# Patient Record
Sex: Female | Born: 1960 | Race: White | Hispanic: No | Smoking: Never smoker
Health system: Southern US, Community
[De-identification: ages and names within clinical notes are randomized; demographics above are authoritative.]

## PROBLEM LIST (undated history)

## (undated) DIAGNOSIS — R928 Other abnormal and inconclusive findings on diagnostic imaging of breast: Secondary | ICD-10-CM

## (undated) DIAGNOSIS — E079 Disorder of thyroid, unspecified: Secondary | ICD-10-CM

## (undated) DIAGNOSIS — E66811 Obesity, class 1: Secondary | ICD-10-CM

## (undated) DIAGNOSIS — E669 Obesity, unspecified: Secondary | ICD-10-CM

## (undated) DIAGNOSIS — K635 Polyp of colon: Secondary | ICD-10-CM

## (undated) DIAGNOSIS — J189 Pneumonia, unspecified organism: Secondary | ICD-10-CM

## (undated) DIAGNOSIS — Z9289 Personal history of other medical treatment: Secondary | ICD-10-CM

## (undated) DIAGNOSIS — A159 Respiratory tuberculosis unspecified: Secondary | ICD-10-CM

## (undated) HISTORY — DX: Obesity, unspecified: E66.9

## (undated) HISTORY — DX: Polyp of colon: K63.5

## (undated) HISTORY — PX: BREAST LUMPECTOMY: SHX2

## (undated) HISTORY — DX: Respiratory tuberculosis unspecified: A15.9

## (undated) HISTORY — DX: Disorder of thyroid, unspecified: E07.9

## (undated) HISTORY — PX: CHOLECYSTECTOMY: SHX55

## (undated) HISTORY — DX: Personal history of other medical treatment: Z92.89

## (undated) HISTORY — PX: TOTAL ABDOMINAL HYSTERECTOMY: SHX209

## (undated) HISTORY — DX: Pneumonia, unspecified organism: J18.9

## (undated) HISTORY — DX: Other abnormal and inconclusive findings on diagnostic imaging of breast: R92.8

## (undated) HISTORY — DX: Obesity, class 1: E66.811

## (undated) HISTORY — PX: ANGIOPLASTY: SHX39

---

## 2012-02-03 ENCOUNTER — Encounter: Payer: Self-pay | Admitting: Obstetrics & Gynecology

## 2012-02-03 ENCOUNTER — Ambulatory Visit (INDEPENDENT_AMBULATORY_CARE_PROVIDER_SITE_OTHER): Payer: BC Managed Care – PPO | Admitting: Obstetrics & Gynecology

## 2012-02-03 VITALS — BP 134/78 | HR 71 | Temp 98.0°F | Resp 16 | Ht 64.5 in | Wt 196.0 lb

## 2012-02-03 DIAGNOSIS — Z124 Encounter for screening for malignant neoplasm of cervix: Secondary | ICD-10-CM

## 2012-02-03 DIAGNOSIS — Z0000 Encounter for general adult medical examination without abnormal findings: Secondary | ICD-10-CM

## 2012-02-03 DIAGNOSIS — N812 Incomplete uterovaginal prolapse: Secondary | ICD-10-CM

## 2012-02-03 NOTE — Addendum Note (Signed)
Addended by: Granville Lewis on: 02/03/2012 04:45 PM   Modules accepted: Orders

## 2012-02-03 NOTE — Progress Notes (Signed)
Subjective:    Kylie Flores is a 51 y.o. female who presents for an annual exam. She has several issues today. She has worsening uterine prolapse associated with constant pelvic "pressure" and polyuria. It sometimes interferes with sex.  The patient is sexually active. GYN screening history: last pap: was normal. The patient wears seatbelts: yes. The patient participates in regular exercise: no. Has the patient ever been transfused or tattooed?: yes ( postpartum transfusion). The patient reports that there is not domestic violence in her life.   Menstrual History: OB History    Grav Para Term Preterm Abortions TAB SAB Ect Mult Living   2 2 2       2       Menarche age: 56 No LMP recorded. Patient is postmenopausal.    The following portions of the patient's history were reviewed and updated as appropriate: allergies, current medications, past family history, past medical history, past social history, past surgical history and problem list.  Review of Systems A comprehensive review of systems was negative. 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 Her mammogram is up to date and normal. She has worked on weight loss and has lost 30 # this year.   Objective:    BP 134/78  Pulse 71  Temp 98 F (36.7 C) (Oral)  Resp 16  Ht 5' 4.5" (1.638 m)  Wt 196 lb (88.905 kg)  BMI 33.12 kg/m2  General Appearance:    Alert, cooperative, no distress, appears stated age  Head:    Normocephalic, without obvious abnormality, atraumatic  Eyes:    PERRL, conjunctiva/corneas clear, EOM's intact, fundi    benign, both eyes  Ears:    Normal TM's and external ear canals, both ears  Nose:   Nares normal, septum midline, mucosa normal, no drainage    or sinus tenderness  Throat:   Lips, mucosa, and tongue normal; teeth and gums normal  Neck:   Supple, symmetrical, trachea midline, no adenopathy;    thyroid:  no enlargement/tenderness/nodules; no carotid   bruit or JVD  Back:      Symmetric, no curvature, ROM normal, no CVA tenderness  Lungs:     Clear to auscultation bilaterally, respirations unlabored  Chest Wall:    No tenderness or deformity   Heart:    Regular rate and rhythm, S1 and S2 normal, no murmur, rub   or gallop  Breast Exam:    No tenderness, masses, or nipple abnormality  Abdomen:     Soft, non-tender, bowel sounds active all four quadrants,    no masses, no organomegaly  Genitalia:    Normal female without lesion, discharge or tenderness, 3rd uterine prolapse, ULN size of uterus, no adnexal masses. I fitted her for a ring pessary (#4 worked well)     Extremities:   Extremities normal, atraumatic, no cyanosis or edema  Pulses:   2+ and symmetric all extremities  Skin:   Skin color, texture, turgor normal, no rashes or lesions  Lymph nodes:   Cervical, supraclavicular, and axillary nodes normal  Neurologic:   CNII-XII intact, normal strength, sensation and reflexes    throughout  .    Assessment:    Healthy female exam.    Plan:     Mammogram.  TSH

## 2012-02-04 LAB — LIPID PANEL
Cholesterol: 197 mg/dL (ref 0–200)
HDL: 79 mg/dL (ref >39)
Triglycerides: 74 mg/dL (ref <150)

## 2012-02-04 LAB — COMPREHENSIVE METABOLIC PANEL
Albumin: 3.9 g/dL (ref 3.5–5.2)
CO2: 27 mEq/L (ref 19–32)
Glucose, Bld: 83 mg/dL (ref 70–99)
Sodium: 139 mEq/L (ref 135–145)
Total Bilirubin: 0.2 mg/dL — ABNORMAL LOW (ref 0.3–1.2)
Total Protein: 6.9 g/dL (ref 6.0–8.3)

## 2012-02-04 LAB — TSH: TSH: 0.488 u[IU]/mL (ref 0.350–4.500)

## 2012-02-19 ENCOUNTER — Ambulatory Visit (INDEPENDENT_AMBULATORY_CARE_PROVIDER_SITE_OTHER): Payer: BC Managed Care – PPO | Admitting: Obstetrics & Gynecology

## 2012-02-19 ENCOUNTER — Encounter: Payer: Self-pay | Admitting: Obstetrics & Gynecology

## 2012-02-19 VITALS — BP 122/83 | HR 78 | Ht 64.5 in | Wt 196.0 lb

## 2012-02-19 DIAGNOSIS — N814 Uterovaginal prolapse, unspecified: Secondary | ICD-10-CM

## 2012-02-19 NOTE — Progress Notes (Signed)
  Subjective:    Patient ID: Kylie Flores, female    DOB: 1961-03-03, 51 y.o.   MRN: [id number]  HPI  Kylie Flores is here today for her pessary placement. At her annual exam I fitted her to a #4 ring. She is able to place and remove it today. It relieves her symptoms of prolapse and also causes her no pain.   Review of Systems     Objective:   Physical Exam        Assessment & Plan:  Symptomatic prolapse- pessary placed RTC 1 month for pessary check She will remove it at night.

## 2012-03-16 ENCOUNTER — Ambulatory Visit: Payer: BC Managed Care – PPO | Admitting: Obstetrics & Gynecology

## 2012-03-16 DIAGNOSIS — N814 Uterovaginal prolapse, unspecified: Secondary | ICD-10-CM

## 2014-04-24 ENCOUNTER — Encounter: Payer: Self-pay | Admitting: Obstetrics & Gynecology

## 2015-08-20 ENCOUNTER — Encounter: Payer: Self-pay | Admitting: Obstetrics & Gynecology

## 2015-08-20 ENCOUNTER — Ambulatory Visit (INDEPENDENT_AMBULATORY_CARE_PROVIDER_SITE_OTHER): Payer: BLUE CROSS/BLUE SHIELD | Admitting: Obstetrics & Gynecology

## 2015-08-20 VITALS — BP 118/71 | HR 69 | Resp 16 | Ht 64.5 in | Wt 199.0 lb

## 2015-08-20 DIAGNOSIS — Z01419 Encounter for gynecological examination (general) (routine) without abnormal findings: Secondary | ICD-10-CM

## 2015-08-20 DIAGNOSIS — Z1151 Encounter for screening for human papillomavirus (HPV): Secondary | ICD-10-CM | POA: Diagnosis not present

## 2015-08-20 DIAGNOSIS — N812 Incomplete uterovaginal prolapse: Secondary | ICD-10-CM | POA: Diagnosis not present

## 2015-08-20 DIAGNOSIS — Z124 Encounter for screening for malignant neoplasm of cervix: Secondary | ICD-10-CM

## 2015-08-20 NOTE — Progress Notes (Signed)
Subjective:    Kylie Flores is a 55 y.o. DW P2 (81 and 63 yo kids, 4 grands) female who presents for an annual exam. She is having a lot of urinary issues, gets up every 2 hours at night to pee, has to stop her travels during the day at least every hour to void. Some occasions of post void dribbling.  The patient is sexually active. GYN screening history: last pap: was normal. The patient wears seatbelts: yes. The patient participates in regular exercise: yes. Has the patient ever been transfused or tattooed?: yes. The patient reports that there is not domestic violence in her life.   Menstrual History: OB History    Gravida Para Term Preterm AB TAB SAB Ectopic Multiple Living   Menarche age: 39  No LMP recorded. Patient is postmenopausal.    The following portions of the patient's history were reviewed and updated as appropriate: allergies, current medications, past family history, past medical history, past social history, past surgical history and problem list.  Review of Systems Pertinent items are noted in HPI.   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  Mammo utd. Flu vaccine 10/16. Colonoscopy due next year (q 3 years). She has dyspareunia due to dryness and the prolapse. She did not like the pessary.  Objective:    BP 118/71 mmHg  Pulse 69  Resp 16  Ht 5' 4.5" (1.638 m)  Wt 199 lb (90.266 kg)  BMI 33.64 kg/m2  General Appearance:    Alert, cooperative, no distress, appears stated age  Head:    Normocephalic, without obvious abnormality, atraumatic  Eyes:    PERRL, conjunctiva/corneas clear, EOM's intact, fundi    benign, both eyes  Ears:    Normal TM's and external ear canals, both ears  Nose:   Nares normal, septum midline, mucosa normal, no drainage    or sinus tenderness  Throat:   Lips, mucosa, and tongue normal; teeth and gums normal  Neck:   Supple, symmetrical, trachea midline, no adenopathy;    thyroid:  no  enlargement/tenderness/nodules; no carotid   bruit or JVD  Back:     Symmetric, no curvature, ROM normal, no CVA tenderness  Lungs:     Clear to auscultation bilaterally, respirations unlabored  Chest Wall:    No tenderness or deformity   Heart:    Regular rate and rhythm, S1 and S2 normal, no murmur, rub   or gallop  Breast Exam:    No tenderness, masses, or nipple abnormality  Abdomen:     Soft, non-tender, bowel sounds active all four quadrants,    no masses, no organomegaly  Genitalia:    Normal female without lesion, discharge or tenderness, moderate atrophy, 3-4th degree uterine prolapse, 2nd degree cystocele, NSSA, NT, mobile, no palpable adnexal masses     Extremities:   Extremities normal, atraumatic, no cyanosis or edema  Pulses:   2+ and symmetric all extremities  Skin:   Skin color, texture, turgor normal, no rashes or lesions  Lymph nodes:   Cervical, supraclavicular, and axillary nodes normal  Neurologic:   CNII-XII intact, normal strength, sensation and reflexes    throughout  .    Assessment:    Healthy female exam.   Urinary issue with prolapse   Plan:     Thin prep Pap smear. with cotesting I will refer her to Dr. Lavella Hammock so she  can get both the uro and the gyn fixed by the same person.

## 2015-08-21 ENCOUNTER — Telehealth: Payer: Self-pay | Admitting: *Deleted

## 2015-08-21 DIAGNOSIS — N952 Postmenopausal atrophic vaginitis: Secondary | ICD-10-CM

## 2015-08-21 MED ORDER — ESTRADIOL 0.1 MG/GM VA CREA: 1.0000 | TOPICAL_CREAM | VAGINAL | Status: DC

## 2015-08-21 NOTE — Telephone Encounter (Signed)
RX for Estrace vaginal cream use 1 GM vaginally 3x weekly per Dr Marice Potter.

## 2015-08-22 LAB — CYTOLOGY - PAP

## 2016-10-03 DIAGNOSIS — S42292A Other displaced fracture of upper end of left humerus, initial encounter for closed fracture: Secondary | ICD-10-CM | POA: Insufficient documentation

## 2017-03-10 ENCOUNTER — Encounter: Payer: Self-pay | Admitting: *Deleted

## 2017-06-26 ENCOUNTER — Emergency Department
Admission: EM | Admit: 2017-06-26 | Discharge: 2017-06-26 | Disposition: A | Discharge status: Home / Self Care | Payer: BLUE CROSS/BLUE SHIELD | Source: Home / Self Care | Attending: Family Medicine | Admitting: Family Medicine

## 2017-06-26 ENCOUNTER — Other Ambulatory Visit: Payer: Self-pay

## 2017-06-26 DIAGNOSIS — B9789 Other viral agents as the cause of diseases classified elsewhere: Secondary | ICD-10-CM

## 2017-06-26 DIAGNOSIS — J020 Streptococcal pharyngitis: Secondary | ICD-10-CM

## 2017-06-26 DIAGNOSIS — J069 Acute upper respiratory infection, unspecified: Secondary | ICD-10-CM

## 2017-06-26 LAB — POCT RAPID STREP A (OFFICE): RAPID STREP A SCREEN: POSITIVE — AB

## 2017-06-26 MED ORDER — AMOXICILLIN 875 MG PO TABS: 875.0000 mg | ORAL_TABLET | Freq: Two times a day (BID) | ORAL | 0 refills | Status: DC

## 2017-06-26 NOTE — Discharge Instructions (Signed)
Take plain guaifenesin (1200mg  extended release tabs such as Mucinex) twice daily, with plenty of water, for cough and congestion.  May add Pseudoephedrine (30mg , one or two every 4 to 6 hours) for sinus congestion.  Get adequate rest.   May use Afrin nasal spray (or generic oxymetazoline) each morning for about 5 days and then discontinue.  Also recommend using saline nasal spray several times daily and saline nasal irrigation (AYR is a common brand).  Use Flonase nasal spray each morning after using Afrin nasal spray and saline nasal irrigation. Try warm salt water gargles for sore throat.  Stop all antihistamines for now, and other non-prescription cough/cold preparations. May take Ibuprofen 200mg , 4 tabs every 8 hours with food for sore throat, body aches, fever, etc. May continue Tussionex at bedtime for cough.  .Marland Kitchen

## 2017-06-26 NOTE — ED Triage Notes (Signed)
Has been sick since last Saturday.  Had a flu test done on Wednesday, negative.

## 2017-06-26 NOTE — ED Provider Notes (Signed)
[facility]    CSN: [id number] Arrival date & time: 06/26/17  0956     History   Chief Complaint Chief Complaint  Patient presents with  . Cough  . Sore Throat  . Fever    HPI Kylie Flores is a 57 y.o. female.   One week ago patient developed typical cold-like symptoms developing over several days, including sore throat, sinus congestion, headache, fatigue, and cough.  Yesterday she developed sweats and fever.  She has had a persistent sore throat. She had a negative test for influenza two days ago.   The history is provided by the patient.    Past Medical History:  Diagnosis Date  . History of blood transfusion   . Obesity (BMI 30.0-34.9)   . Pneumonia   . Thyroid disease   . Tuberculosis     There are no active problems to display for this patient.   Past Surgical History:  Procedure Laterality Date  . ANGIOPLASTY    . BREAST LUMPECTOMY    . CHOLECYSTECTOMY      OB History    Gravida Para Term Preterm AB Living   2 2 2     2    SAB TAB Ectopic Multiple Live Births                   Home Medications    Prior to Admission medications   Medication Sig Start Date End Date Taking? Authorizing Provider  amoxicillin (AMOXIL) 875 MG tablet Take 1 tablet (875 mg total) by mouth 2 (two) times daily. 06/26/17   Lattie Haw, MD  estradiol (ESTRACE VAGINAL) 0.1 MG/GM vaginal cream Place 1 Applicatorful vaginally 3 (three) times a week. 08/21/15   Allie Bossier, MD    Family History Family History  Problem Relation Age of Onset  . Diabetes Maternal Grandfather   . Cancer Mother        skin    Social History Social History   Tobacco Use  . Smoking status: Never Smoker  . Smokeless tobacco: Never Used  Substance Use Topics  . Alcohol use: Yes    Comment: rarely  . Drug use: No     Allergies   Patient has no known allergies.   Review of Systems Review of Systems + sore throat + cough No pleuritic pain No wheezing + nasal  congestion + post-nasal drainage No sinus pain/pressure No itchy/red eyes ? earache No hemoptysis No SOB + fever, + chills No nausea No vomiting No abdominal pain No diarrhea No urinary symptoms No skin rash + fatigue No myalgias + headache Used OTC meds without relief   Physical Exam Triage Vital Signs ED Triage Vitals [06/26/17 1031]  Enc Vitals Group     BP 121/87     Pulse Rate 98     Resp      Temp 98.7 F (37.1 C)     Temp src      SpO2 96 %     Weight 204 lb (92.5 kg)     Height 5' 4.5" (1.638 m)     Head Circumference      Peak Flow      Pain Score 0     Pain Loc      Pain Edu?      Excl. in GC?    No data found.  Updated Vital Signs BP 121/87 (BP Location: Right Arm)   Pulse 98   Temp 98.7 F (37.1 C)   Ht  5' 4.5" (1.638 m)   Wt 204 lb (92.5 kg)   SpO2 96%   BMI 34.48 kg/m   Visual Acuity Right Eye Distance:   Left Eye Distance:   Bilateral Distance:    Right Eye Near:   Left Eye Near:    Bilateral Near:     Physical Exam Nursing notes and Vital Signs reviewed. Appearance:  Patient appears stated age, and in no acute distress Eyes:  Pupils are equal, round, and reactive to light and accomodation.  Extraocular movement is intact.  Conjunctivae are not inflamed  Ears:  Canals normal.  Tympanic membranes normal.  Nose:  Mildly congested turbinates.  No sinus tenderness.   Pharynx:  Erythematous Neck:  Supple.  Enlarged posterior/lateral nodes are palpated bilaterally, tender to palpation on the left.  Tender tonsillar nodes. Lungs:  Clear to auscultation.  Breath sounds are equal.  Moving air well. Heart:  Regular rate and rhythm without murmurs, rubs, or gallops.  Abdomen:  Nontender without masses or hepatosplenomegaly.  Bowel sounds are present.  No CVA or flank tenderness.  Extremities:  No edema.  Skin:  No rash present.    UC Treatments / Results  Labs (all labs ordered are listed, but only abnormal results are displayed) Labs  Reviewed  POCT RAPID STREP A (OFFICE) - Abnormal; Notable for the following components:      Result Value   Rapid Strep A Screen Positive (*)    All other components within normal limits    EKG  EKG Interpretation None       Radiology No results found.  Procedures Procedures (including critical care time)  Medications Ordered in UC Medications - No data to display   Initial Impression / Assessment and Plan / UC Course  I have reviewed the triage vital signs and the nursing notes.  Pertinent labs & imaging results that were available during my care of the patient were reviewed by me and considered in my medical decision making (see chart for details).    Begin amoxicillin for 10 days. Take plain guaifenesin (1200mg  extended release tabs such as Mucinex) twice daily, with plenty of water, for cough and congestion.  May add Pseudoephedrine (30mg , one or two every 4 to 6 hours) for sinus congestion.  Get adequate rest.   May use Afrin nasal spray (or generic oxymetazoline) each morning for about 5 days and then discontinue.  Also recommend using saline nasal spray several times daily and saline nasal irrigation (AYR is a common brand).  Use Flonase nasal spray each morning after using Afrin nasal spray and saline nasal irrigation. Try warm salt water gargles for sore throat.  Stop all antihistamines for now, and other non-prescription cough/cold preparations. May take Ibuprofen 200mg , 4 tabs every 8 hours with food for sore throat, body aches, fever, etc. May continue Tussionex at bedtime for cough. Followup with Family Doctor if not improved in 10 days.    Final Clinical Impressions(s) / UC Diagnoses   Final diagnoses:  Strep pharyngitis  Viral URI with cough    ED Discharge Orders        Ordered    amoxicillin (AMOXIL) 875 MG tablet  2 times daily     06/26/17 1112           Lattie HawBeese, Sarah-Jane Nazario A, MD 06/26/17 1346

## 2018-02-16 ENCOUNTER — Encounter: Payer: Self-pay | Admitting: *Deleted

## 2018-03-08 ENCOUNTER — Other Ambulatory Visit: Payer: Self-pay | Admitting: Sports Medicine

## 2018-03-08 DIAGNOSIS — S4991XA Unspecified injury of right shoulder and upper arm, initial encounter: Secondary | ICD-10-CM

## 2018-03-12 ENCOUNTER — Ambulatory Visit
Admission: RE | Admit: 2018-03-12 | Discharge: 2018-03-12 | Disposition: A | Discharge status: Home / Self Care | Payer: Worker's Compensation | Source: Ambulatory Visit | Attending: Sports Medicine | Admitting: Sports Medicine

## 2018-03-12 DIAGNOSIS — S4991XA Unspecified injury of right shoulder and upper arm, initial encounter: Secondary | ICD-10-CM

## 2018-04-03 DIAGNOSIS — S42254A Nondisplaced fracture of greater tuberosity of right humerus, initial encounter for closed fracture: Secondary | ICD-10-CM

## 2018-04-03 HISTORY — DX: Nondisplaced fracture of greater tuberosity of right humerus, initial encounter for closed fracture: S42.254A

## 2019-03-17 ENCOUNTER — Encounter: Payer: Self-pay | Admitting: *Deleted

## 2019-03-21 ENCOUNTER — Telehealth: Payer: Self-pay | Admitting: Obstetrics & Gynecology

## 2019-03-21 NOTE — Telephone Encounter (Signed)
I received the results of her suspicious mammogram. I have not seen her since 2017. I called and left a voicemail about the mammogram and the need for followup. I will send Sherryle Lis a message to check with her again.

## 2019-03-29 ENCOUNTER — Encounter: Payer: Self-pay | Admitting: *Deleted

## 2019-03-31 ENCOUNTER — Encounter: Payer: Self-pay | Admitting: *Deleted

## 2019-04-05 ENCOUNTER — Encounter: Payer: Self-pay | Admitting: *Deleted

## 2019-10-05 ENCOUNTER — Encounter: Payer: Self-pay | Admitting: Obstetrics & Gynecology

## 2019-10-05 DIAGNOSIS — R928 Other abnormal and inconclusive findings on diagnostic imaging of breast: Secondary | ICD-10-CM | POA: Insufficient documentation

## 2019-11-04 IMAGING — MR MR SHOULDER*R* W/O CM
5 series · 34 of 40 positions shown · non-contrast
Comparison: None.

CLINICAL DATA: Right shoulder pain with limited range of motion and
weakness since falling 3 months ago. No previous relevant surgery.

EXAM:
MRI OF THE RIGHT SHOULDER WITHOUT CONTRAST
TECHNIQUE: Multiplanar, multisequence MR imaging of the shoulder was performed.
No intravenous contrast was administered.

[Series 3: PD fat-sat · axial · 4.0mm · 0.55mm/px · z∈[-32,+52]mm · 8 of 20 slices shown (1 of 2)]
[im 1/20]
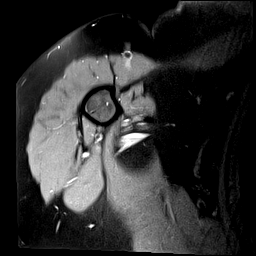
[im 3/20]
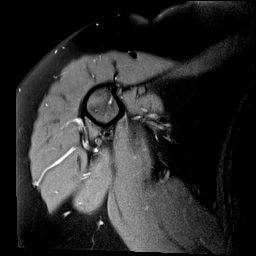
[im 7/20]
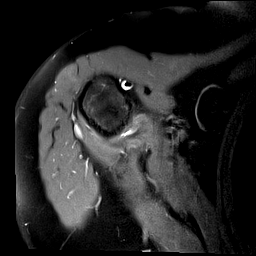
[im 9/20]
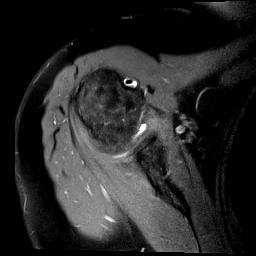
[im 11/20]
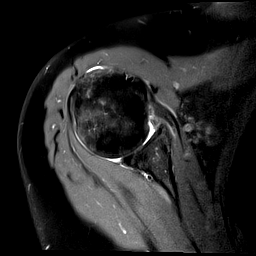
[im 13/20]
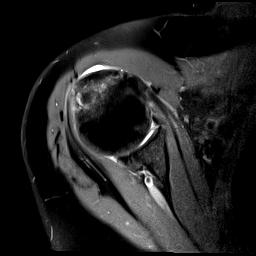
[im 17/20]
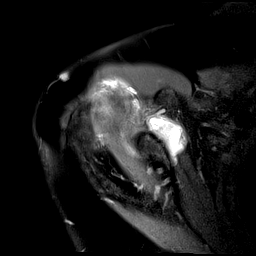
[im 20/20]
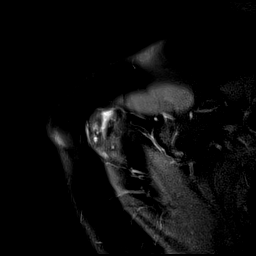

[Series 4: T2 fat-sat · oblique · 4.0mm · 0.55mm/px · 7 of 16 slices shown (1 of 2)]
[im 1/16]
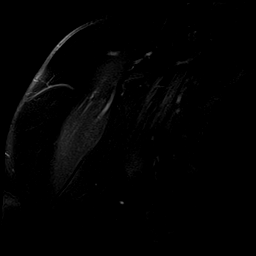
[im 3/16]
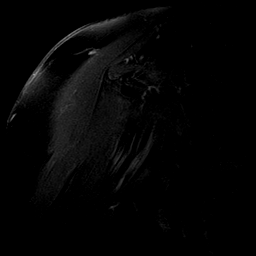
[im 6/16]
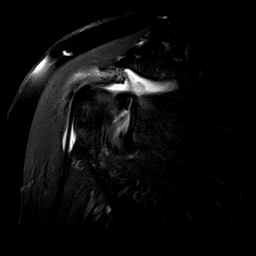
[im 8/16]
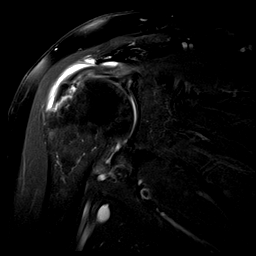
[im 11/16]
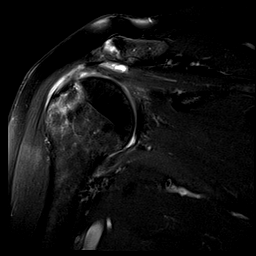
[im 13/16]
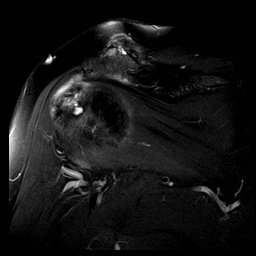
[im 16/16]
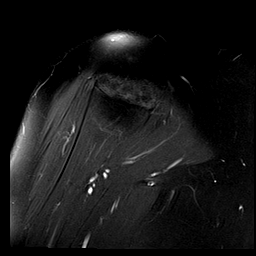

[Series 5: PD fat-sat · oblique · 4.0mm · 0.27mm/px · 7 of 16 slices shown (2 of 2)]
[im 1/16]
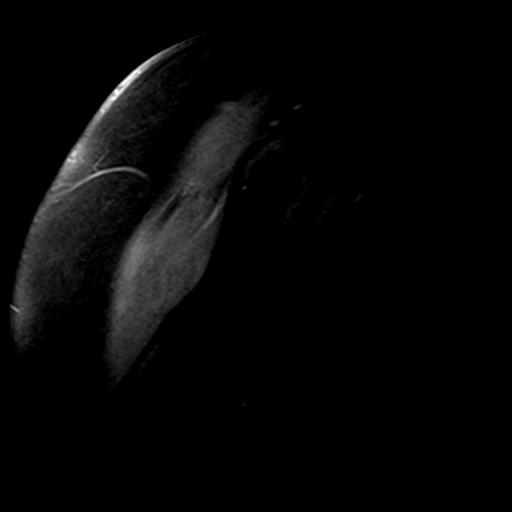
[im 3/16]
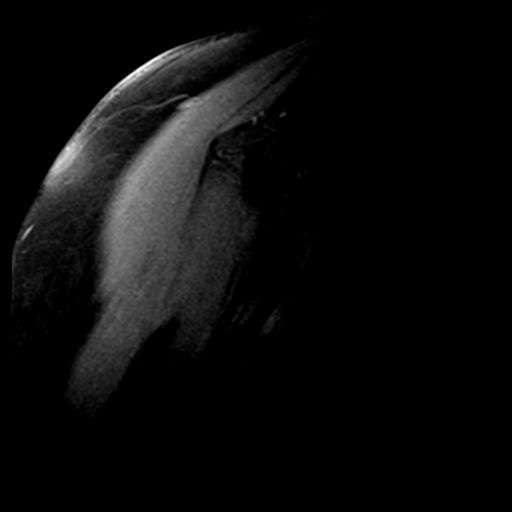
[im 6/16]
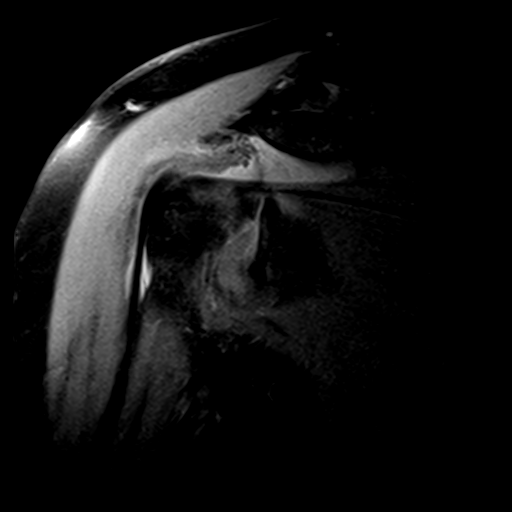
[im 8/16]
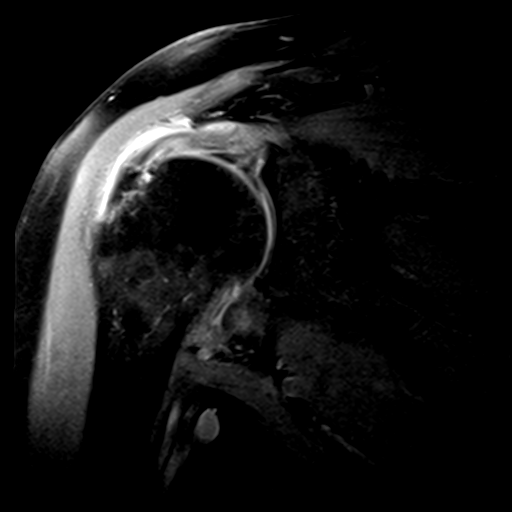
[im 11/16]
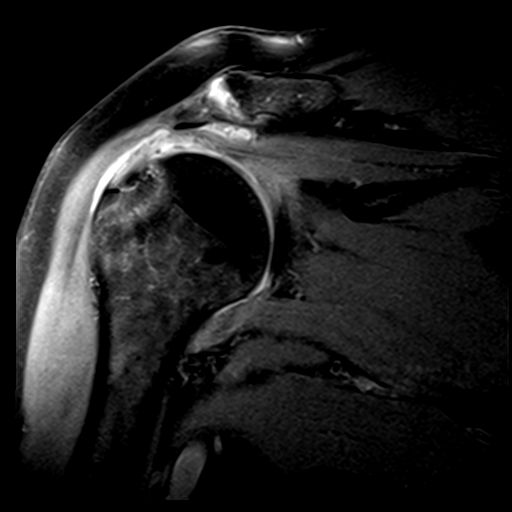
[im 13/16]
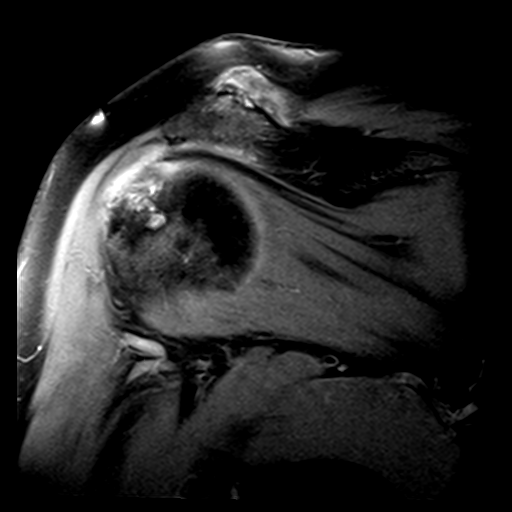
[im 16/16]
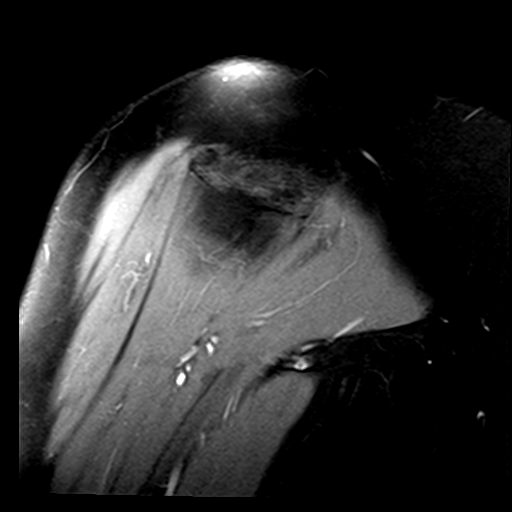

[Series 6: T1 · coronal · 4.0mm · 0.27mm/px · 4 of 18 slices shown]
[im 1/18]
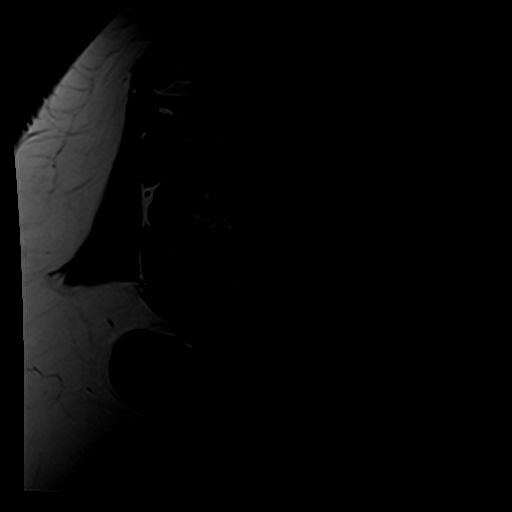
[im 3/18]
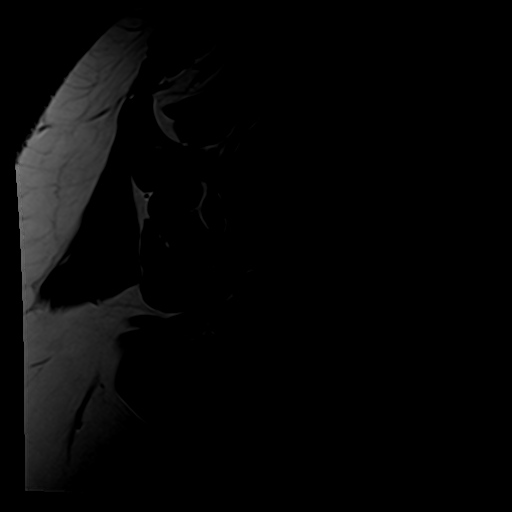
[im 5/18]
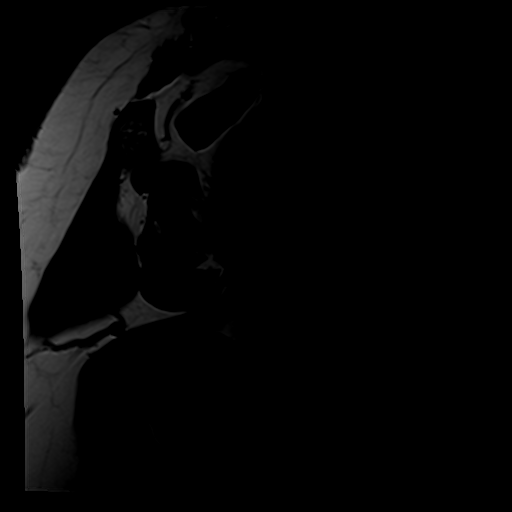
[im 8/18]
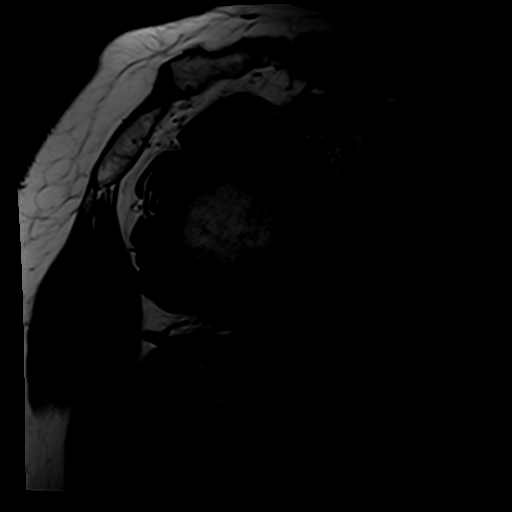

[Series 7: T2 fat-sat · coronal · 4.0mm · 0.55mm/px · 8 of 18 slices shown (2 of 2)]
[im 1/18]
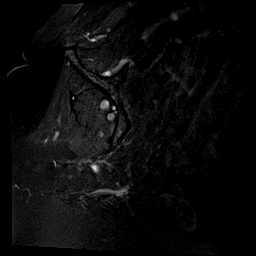
[im 3/18]
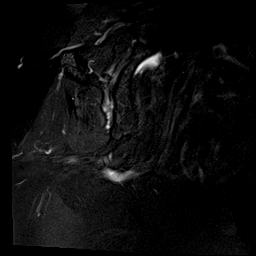
[im 5/18]
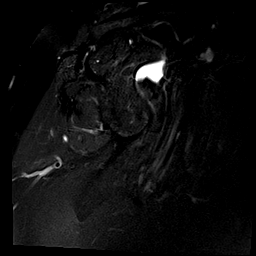
[im 8/18]
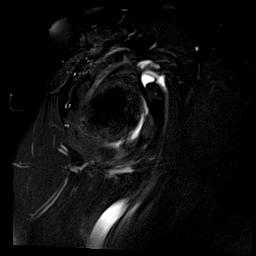
[im 10/18]
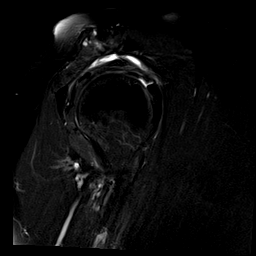
[im 13/18]
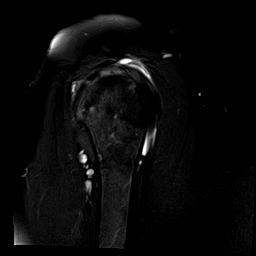
[im 15/18]
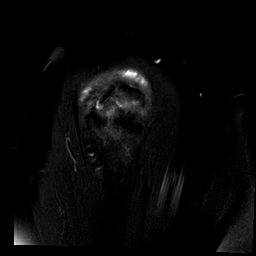
[im 18/18]
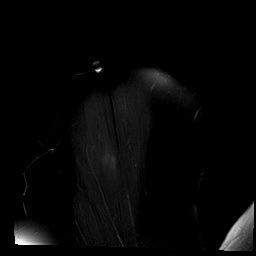

[34 of 40 positions shown; findings below may reference images not displayed]

FINDINGS: Rotator cuff: There is supraspinatus and infraspinatus tendinosis.
The infraspinatus tendon is attenuated with bursal and articular
surface irregularity, best seen on the coronal images. No
full-thickness tendon tear or tendon retraction identified. The
subscapularis and teres minor tendons appear normal.

Muscles:  No focal muscular atrophy or edema.

Biceps long head:  Intact and normally positioned.

Acromioclavicular Joint: The acromion is type 1. There are
mild-to-moderate acromioclavicular degenerative changes. There is a
moderate amount of fluid in the subacromial-subdeltoid bursa.

Glenohumeral Joint: No significant shoulder joint effusion. There
are mild glenohumeral degenerative changes.

Labrum: Labral assessment is limited by the lack of joint fluid.
There are degenerative changes in the labrum superiorly and
posteriorly. No discrete labral tear or paralabral cyst identified.

Bones: There is suspicion of a nondisplaced fracture involving the
greater tuberosity, best seen on coronal images 8 and 9. There is
mild adjacent marrow edema. There is subcortical cyst formation
posteriorly in the humeral head near the infraspinatus insertion. No
other acute osseous findings.

Other: No significant soft tissue findings.
IMPRESSION: 1. Suspicion of healing nondisplaced fracture of the greater
tuberosity. The supraspinatus and infraspinatus tendons insert on
this suspected avulsion fracture fragment. Plain film correlation
recommended.
2. Underlying supraspinatus and infraspinatus tendinosis with
partial tearing of the infraspinatus tendon. No evidence of
full-thickness rotator cuff tear.
3. Moderate subacromial-subdeltoid bursitis.
4. Mild-to-moderate acromioclavicular degenerative changes.
5. Mild labral degeneration without discrete tear.

## 2020-01-12 ENCOUNTER — Other Ambulatory Visit: Payer: Self-pay

## 2020-01-12 ENCOUNTER — Ambulatory Visit (INDEPENDENT_AMBULATORY_CARE_PROVIDER_SITE_OTHER): Payer: PRIVATE HEALTH INSURANCE | Admitting: Medical-Surgical

## 2020-01-12 ENCOUNTER — Encounter: Payer: Self-pay | Admitting: Medical-Surgical

## 2020-01-12 VITALS — BP 121/82 | HR 64 | Temp 97.9°F | Ht 64.75 in | Wt 212.3 lb

## 2020-01-12 DIAGNOSIS — Z1159 Encounter for screening for other viral diseases: Secondary | ICD-10-CM

## 2020-01-12 DIAGNOSIS — Z114 Encounter for screening for human immunodeficiency virus [HIV]: Secondary | ICD-10-CM

## 2020-01-12 DIAGNOSIS — Z7689 Persons encountering health services in other specified circumstances: Secondary | ICD-10-CM

## 2020-01-12 DIAGNOSIS — E079 Disorder of thyroid, unspecified: Secondary | ICD-10-CM

## 2020-01-12 DIAGNOSIS — L609 Nail disorder, unspecified: Secondary | ICD-10-CM | POA: Insufficient documentation

## 2020-01-12 DIAGNOSIS — I890 Lymphedema, not elsewhere classified: Secondary | ICD-10-CM | POA: Insufficient documentation

## 2020-01-12 DIAGNOSIS — Z0000 Encounter for general adult medical examination without abnormal findings: Secondary | ICD-10-CM | POA: Diagnosis not present

## 2020-01-12 DIAGNOSIS — Z23 Encounter for immunization: Secondary | ICD-10-CM | POA: Diagnosis not present

## 2020-01-12 DIAGNOSIS — R42 Dizziness and giddiness: Secondary | ICD-10-CM | POA: Insufficient documentation

## 2020-01-12 NOTE — Progress Notes (Signed)
zin  New Patient Office Visit  Subjective:  Patient ID: Kylie Flores, female    DOB: [birth year]  Age: 59 y.o. MRN: [id number]  CC:  Chief Complaint  Patient presents with  . Establish Care    HPI Kylie Flores presents to establish care.  No PCP care in several years.  Has been seen by dermatology who noted she white discoloration under her nailbeds.  This has been present since approximately November or December 2020.  Dermatology did check kidney and liver function but there were no abnormalities found.  They recommended she establish with a PCP for a full panel of blood work.  Has an appointment with gynecology next week due to an abnormal mammogram and follow up.  She does have a history of abnormal mammograms with a left breast lumpectomy.     History of thyroid mass with benign biopsy. No recent thyroid function labs assessed. Patient notes mass feels to have gotten bigger.  Lymphedema of the right leg s/p angioplasty and vein ablation. Chronic RLE edema.  History of vertigo with intermittent flares. 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  Past Medical History:  Diagnosis Date  . Abnormal mammogram   . Colon polyps   . History of blood transfusion   . Obesity (BMI 30.0-34.9)   . Pneumonia   . Thyroid disease   . Tuberculosis     Past Surgical History:  Procedure Laterality Date  . ABDOMINAL HYSTERECTOMY    . ANGIOPLASTY    . BREAST LUMPECTOMY    . CHOLECYSTECTOMY      Family History  Problem Relation Age of Onset  . Diabetes Maternal Grandfather   . Skin cancer Mother   . Colon cancer Maternal Grandmother     Social History   Socioeconomic History  . Marital status: Divorced    Spouse name: Not on file  . Number of children: Not on file  . Years of education: Not on file  . Highest education level: Not on file  Occupational History  . Occupation: adm Water engineer: [employer]  Tobacco Use  . Smoking status: Never  Smoker  . Smokeless tobacco: Never Used  Vaping Use  . Vaping Use: Never used  Substance and Sexual Activity  . Alcohol use: Yes    Comment: rarely  . Drug use: Never  . Sexual activity: Not Currently    Partners: Male  Other Topics Concern  . Not on file  Social History Narrative  . Not on file   Social Determinants of Health   Financial Resource Strain:   . Difficulty of Paying Living Expenses:   Food Insecurity:   . Worried About Programme researcher, broadcasting/film/video in the Last Year:   . Barista in the Last Year:   Transportation Needs:   . Freight forwarder (Medical):   Marland Kitchen Lack of Transportation (Non-Medical):   Physical Activity:   . Days of Exercise per Week:   . Minutes of Exercise per Session:   Stress:   . Feeling of Stress :   Social Connections:   . Frequency of Communication with Friends and Family:   . Frequency of Social Gatherings with Friends and Family:   . Attends Religious Services:   . Active Member of Clubs or Organizations:   . Attends Banker Meetings:   Marland Kitchen Marital Status:   Intimate Partner Violence:   . Fear of Current or Ex-Partner:   . Emotionally  Abused:   Marland Kitchen Physically Abused:   . Sexually Abused:     ROS Review of Systems  Constitutional: Negative.   HENT: Negative.   Eyes: Negative.   Respiratory: Negative.   Cardiovascular: Positive for leg swelling (right leg). Negative for chest pain and palpitations.  Gastrointestinal: Negative.   Genitourinary: Positive for frequency. Negative for dysuria, hematuria, urgency, vaginal bleeding, vaginal discharge and vaginal pain.  Musculoskeletal: Positive for arthralgias (left knee).  Skin: Negative.   Allergic/Immunologic: Negative for environmental allergies and food allergies.  Neurological: Positive for dizziness (occasional flares of vertigo). Negative for numbness.  Hematological: Negative.   Psychiatric/Behavioral: Negative.  Negative for self-injury and suicidal ideas.     Objective:   Today's Vitals: BP 121/82   Pulse 64   Temp 97.9 F (36.6 C) (Oral)   Ht 5' 4.75" (1.645 m)   Wt 212 lb 4.8 oz (96.3 kg)   SpO2 99%   BMI 35.60 kg/m   Physical Exam Vitals reviewed.  Constitutional:      General: She is not in acute distress.    Appearance: Normal appearance.  HENT:     Head: Normocephalic and atraumatic.     Right Ear: Tympanic membrane, ear canal and external ear normal. There is no impacted cerumen.     Left Ear: Tympanic membrane, ear canal and external ear normal. There is no impacted cerumen.     Nose: Nose normal.     Mouth/Throat:     Mouth: Mucous membranes are moist.     Pharynx: No oropharyngeal exudate or posterior oropharyngeal erythema.  Eyes:     Extraocular Movements: Extraocular movements intact.     Pupils: Pupils are equal, round, and reactive to light.  Neck:     Thyroid: Thyroid mass present. No thyroid tenderness.   Cardiovascular:     Rate and Rhythm: Normal rate and regular rhythm.     Pulses: Normal pulses.     Heart sounds: Normal heart sounds. No murmur heard.  No friction rub. No gallop.   Pulmonary:     Effort: Pulmonary effort is normal. No respiratory distress.     Breath sounds: Normal breath sounds. No wheezing.  Abdominal:     General: Bowel sounds are normal. There is no distension.     Palpations: Abdomen is soft. There is no mass.     Tenderness: There is no abdominal tenderness. There is no guarding or rebound.     Hernia: No hernia is present.  Musculoskeletal:     Cervical back: Normal range of motion and neck supple. No rigidity or tenderness.     Right lower leg: Edema present.     Left lower leg: No edema.  Lymphadenopathy:     Cervical: No cervical adenopathy.  Skin:    General: Skin is warm and dry.  Neurological:     Mental Status: She is alert and oriented to person, place, and time.  Psychiatric:        Mood and Affect: Mood normal.        Behavior: Behavior normal.         Thought Content: Thought content normal.        Judgment: Judgment normal.     Assessment & Plan:   1. Encounter to establish care Reviewed available records and discussed health concerns. Completing blood work and physical today. Up to date on other preventative care.  2. Annual physical exam Checking CBC, CMP today. - CBC - CMP and Liver  3. Nail abnormalities Appearance consistent with Lyndsay nails (apparent leukonychia). Since we are doing routine labs, we will add on an iron panel and zinc level. - CBC - CMP and Liver - Fe+TIBC+Fer - Zinc  4.Thyroid mass Checking thyroid panel today.  5. Need for Tdap vaccination Tdap administered in office today by MA. - Tdap vaccine greater than or equal to 7yo IM  6. Encounter for screening for HIV Discussed screening recommendations. Patient agreeable so we will add this to labs today. - HIV Antibody (routine testing w rflx)  7. Encounter for hepatitis C screening test for low risk patient Discussed screening recommendations. Patient agreeable so we will add this to labs today. - Hepatitis C antibody  Outpatient Encounter Medications as of 01/12/2020  Medication Sig  . [DISCONTINUED] amoxicillin (AMOXIL) 875 MG tablet Take 1 tablet (875 mg total) by mouth 2 (two) times daily.  . [DISCONTINUED] estradiol (ESTRACE VAGINAL) 0.1 MG/GM vaginal cream Place 1 Applicatorful vaginally 3 (three) times a week.   No facility-administered encounter medications on file as of 01/12/2020.    Follow-up: Return in about 1 year (around 01/11/2021) for annual physical exam or sooner if needed.   Thayer Ohm, DNP, APRN, FNP-BC [facility]

## 2020-01-12 NOTE — Progress Notes (Unsigned)
Open in error

## 2020-01-16 ENCOUNTER — Ambulatory Visit (INDEPENDENT_AMBULATORY_CARE_PROVIDER_SITE_OTHER): Payer: PRIVATE HEALTH INSURANCE | Admitting: Obstetrics & Gynecology

## 2020-01-16 ENCOUNTER — Other Ambulatory Visit: Payer: Self-pay

## 2020-01-16 ENCOUNTER — Encounter: Payer: Self-pay | Admitting: Obstetrics & Gynecology

## 2020-01-16 VITALS — Ht 64.0 in | Wt 212.0 lb

## 2020-01-16 DIAGNOSIS — R928 Other abnormal and inconclusive findings on diagnostic imaging of breast: Secondary | ICD-10-CM

## 2020-01-16 DIAGNOSIS — Z01419 Encounter for gynecological examination (general) (routine) without abnormal findings: Secondary | ICD-10-CM

## 2020-01-18 ENCOUNTER — Encounter: Payer: Self-pay | Admitting: Medical-Surgical

## 2020-01-18 NOTE — Progress Notes (Signed)
Subjective:     Kylie Flores is a 59 y.o. female here for a routine exam.  Current complaints: needs diagnostic mammogram(gets screening at [facility]).     Gynecologic History No LMP recorded. Patient has had a hysterectomy. Contraception: none Last Pap: none--pt is s/p hyeterectomy. Last mammogram: 4/21--needs mammogram in 5 months per recommendation of radiology.  Obstetric History OB History  Gravida Para Term Preterm AB Living  2 2 2     2   SAB TAB Ectopic Multiple Live Births               # Outcome Date GA Lbr Len/2nd Weight Sex Delivery Anes PTL Lv  2 Term      Vag-Spont     1 Term      Vag-Spont        The following portions of the patient's history were reviewed and updated as appropriate: allergies, current medications, past family history, past medical history, past social history, past surgical history and problem list.  Review of Systems Pertinent items noted in HPI and remainder of comprehensive ROS otherwise negative.    Objective:      Vitals:   01/16/20 1455  Weight: (!) 212 lb (96.2 kg)  Height: 5\' 4"  (1.626 m)   Vitals:  WNL General appearance: alert, cooperative and no distress  HEENT: Normocephalic, without obvious abnormality, atraumatic Eyes: negative Throat: lips, mucosa, and tongue normal; teeth and gums normal  Respiratory: Clear to auscultation bilaterally  CV: Regular rate and rhythm  Breasts:  Normal appearance, no masses or tenderness, no nipple retraction or dimpling  GI: Soft, non-tender; bowel sounds normal; no masses,  no organomegaly  GU: External Genitalia:  Tanner V, no lesion Urethra:  No prolapse   Vagina: Pink, normal rugae, no blood or discharge  Cervix: Surgically absent  Uterus:  Surgically absent  Adnexa: non tender  Musculoskeletal: Stable lymph edema of right lower extremity  Skin: No lesions or rash  Lymphatic: Axillary adenopathy: none     Psychiatric: Normal mood and behavior       Assessment:    Healthy  female exam.    Plan:   1.  Mammogram ordered 2.  PCP follows [facility] 3.  Pap not indicated (s/p hysterectomy) 4.  Colonoscopy 2019--polyps found; [facility] in [facility]

## 2020-01-20 LAB — CMP AND LIVER
ALT: 22 IU/L (ref 0–32)
AST: 21 IU/L (ref 0–40)
Albumin: 4.2 g/dL (ref 3.8–4.9)
Alkaline Phosphatase: 95 IU/L (ref 48–121)
BUN: 14 mg/dL (ref 6–24)
Bilirubin Total: 0.2 mg/dL (ref 0.0–1.2)
Bilirubin, Direct: 0.1 mg/dL (ref 0.00–0.40)
CO2: 23 mmol/L (ref 20–29)
Calcium: 9 mg/dL (ref 8.7–10.2)
Chloride: 103 mmol/L (ref 96–106)
Creatinine, Ser: 0.75 mg/dL (ref 0.57–1.00)
GFR calc Af Amer: 101 mL/min/{1.73_m2} (ref >59)
GFR calc non Af Amer: 88 mL/min/{1.73_m2} (ref >59)
Glucose: 99 mg/dL (ref 65–99)
Potassium: 3.9 mmol/L (ref 3.5–5.2)
Sodium: 141 mmol/L (ref 134–144)
Total Protein: 6.8 g/dL (ref 6.0–8.5)

## 2020-01-20 LAB — ZINC: Zinc: 86 ug/dL (ref 44–115)

## 2020-01-20 LAB — IRON,TIBC AND FERRITIN PANEL
Ferritin: 79 ng/mL (ref 15–150)
Iron Saturation: 13 % — ABNORMAL LOW (ref 15–55)
Iron: 36 ug/dL (ref 27–159)
Total Iron Binding Capacity: 282 ug/dL (ref 250–450)
UIBC: 246 ug/dL (ref 131–425)

## 2020-01-20 LAB — THYROID PANEL WITH TSH
Free Thyroxine Index: 1.6 (ref 1.2–4.9)
T3 Uptake Ratio: 25 % (ref 24–39)
T4, Total: 6.3 ug/dL (ref 4.5–12.0)
TSH: 0.485 u[IU]/mL (ref 0.450–4.500)

## 2020-01-20 LAB — CBC
Hematocrit: 38.4 % (ref 34.0–46.6)
Hemoglobin: 12.7 g/dL (ref 11.1–15.9)
MCH: 28.5 pg (ref 26.6–33.0)
MCHC: 33.1 g/dL (ref 31.5–35.7)
MCV: 86 fL (ref 79–97)
Platelets: 255 10*3/uL (ref 150–450)
RBC: 4.46 x10E6/uL (ref 3.77–5.28)
RDW: 14 % (ref 11.7–15.4)
WBC: 4.9 10*3/uL (ref 3.4–10.8)

## 2020-01-20 LAB — HIV ANTIBODY (ROUTINE TESTING W REFLEX): HIV Screen 4th Generation wRfx: NONREACTIVE

## 2020-01-20 LAB — HEPATITIS C ANTIBODY: Hep C Virus Ab: <0.1 s/co ratio (ref 0.0–0.9)

## 2020-01-20 NOTE — Progress Notes (Signed)
Pt has seen results on MyChart.

## 2021-01-11 ENCOUNTER — Encounter: Payer: PRIVATE HEALTH INSURANCE | Admitting: Medical-Surgical

## 2021-02-13 ENCOUNTER — Encounter: Payer: Self-pay | Admitting: Medical-Surgical

## 2021-02-13 DIAGNOSIS — E079 Disorder of thyroid, unspecified: Secondary | ICD-10-CM

## 2021-02-18 ENCOUNTER — Encounter: Payer: Self-pay | Admitting: Medical-Surgical

## 2021-02-18 ENCOUNTER — Ambulatory Visit (INDEPENDENT_AMBULATORY_CARE_PROVIDER_SITE_OTHER): Payer: No Typology Code available for payment source | Admitting: Medical-Surgical

## 2021-02-18 ENCOUNTER — Other Ambulatory Visit: Payer: Self-pay

## 2021-02-18 VITALS — BP 130/81 | HR 78 | Temp 98.3°F | Ht 64.25 in | Wt 216.7 lb

## 2021-02-18 DIAGNOSIS — Z0000 Encounter for general adult medical examination without abnormal findings: Secondary | ICD-10-CM | POA: Diagnosis not present

## 2021-02-18 DIAGNOSIS — E079 Disorder of thyroid, unspecified: Secondary | ICD-10-CM

## 2021-02-18 DIAGNOSIS — Z23 Encounter for immunization: Secondary | ICD-10-CM

## 2021-02-18 DIAGNOSIS — Z131 Encounter for screening for diabetes mellitus: Secondary | ICD-10-CM

## 2021-02-18 NOTE — Progress Notes (Signed)
HPI: Kylie Flores is a 60 y.o. female who  has a past medical history of Abnormal mammogram, Colon polyps, History of blood transfusion, Obesity (BMI 30.0-34.9), Pneumonia, Thyroid disease, and Tuberculosis.  she presents to [facility] today, 02/18/21,  for chief complaint of: Annual physical exam  Dentist: Every 6 months, up-to-date, no concerns Eye exam: Overdue, currently wearing readers but feels like she may need a prescription at this point Exercise: She was walking daily but she had some difficulty with left leg pain, this has now resolved and she is looking forward to returning to her regular walking routine Diet: Well-balanced with no dietary restriction Pap smear: Hysterectomy Mammogram: Up-to-date Colon cancer screening: Up-to-date COVID vaccine: Done  Concerns: None  Past medical, surgical, social and family history reviewed:  Patient Active Problem List   Diagnosis Date Noted   Lymphedema of right lower extremity 01/12/2020   Vertigo 01/12/2020   Nail abnormalities 01/12/2020   Abnormal mammogram 10/05/2019    Past Surgical History:  Procedure Laterality Date   ANGIOPLASTY     BREAST LUMPECTOMY     CHOLECYSTECTOMY     TOTAL ABDOMINAL HYSTERECTOMY      Social History   Tobacco Use   Smoking status: Never   Smokeless tobacco: Never  Substance Use Topics   Alcohol use: Yes    Comment: rarely    Family History  Problem Relation Age of Onset   Diabetes Maternal Grandfather    Colon cancer Maternal Grandfather    Skin cancer Mother      Current medication list and allergy/intolerance information reviewed:    No current outpatient medications on file.   No current facility-administered medications for this visit.    No Known Allergies    Review of Systems: Constitutional:  No  fever, no chills, No recent illness, No unintentional weight changes. No significant fatigue.  HEENT: No  headache, no vision change,  no hearing change, No sore throat, No  sinus pressure Cardiac: No  chest pain, No  pressure, No palpitations, No  Orthopnea Respiratory:  No  shortness of breath. No  Cough Gastrointestinal: No  abdominal pain, No  nausea, No  vomiting,  No  blood in stool, No  diarrhea, No  constipation  Musculoskeletal: No new myalgia/arthralgia Skin: No  Rash, No other wounds/concerning lesions Genitourinary: No  incontinence, No  abnormal genital bleeding, No abnormal genital discharge Hem/Onc: No  easy bruising/bleeding, No  abnormal lymph node Endocrine: No cold intolerance,  No heat intolerance. No polyuria/polydipsia/polyphagia  Neurologic: No  weakness, No  dizziness, No  slurred speech/focal weakness/facial droop Psychiatric: No  concerns with depression, No  concerns with anxiety, No sleep problems, No mood problems  Exam:  BP 130/81   Pulse 78   Temp 98.3 F (36.8 C)   Ht 5' 4.25" (1.632 m)   Wt 216 lb 11.2 oz (98.3 kg)   SpO2 97%   BMI 36.91 kg/m  Constitutional: VS see above. General Appearance: alert, well-developed, well-nourished, NAD Eyes: Normal lids and conjunctive, non-icteric sclera Ears, Nose, Mouth, Throat: MMM, Normal external inspection ears/nares/mouth/lips/gums. TM normal bilaterally.   Neck: No masses, trachea midline. No thyroid enlargement. No tenderness/mass appreciated. No lymphadenopathy Respiratory: Normal respiratory effort. no wheeze, no rhonchi, no rales Cardiovascular: S1/S2 normal, no murmur, no rub/gallop auscultated. RRR.  + Right lower extremity edema secondary to lymphedema. Pedal pulse II/IV bilaterally DP and PT. No carotid bruit or JVD. No abdominal aortic bruit. Gastrointestinal: Nontender, no masses. No  hepatomegaly, no splenomegaly. No hernia appreciated. Bowel sounds normal. Rectal exam deferred.  Musculoskeletal: Gait normal. No clubbing/cyanosis of digits.  Neurological: Normal balance/coordination. No tremor. No cranial nerve deficit on limited exam.  Motor and sensation intact and symmetric. Cerebellar reflexes intact.  Skin: warm, dry, intact. No rash/ulcer. No concerning nevi or subq nodules on limited exam.   Psychiatric: Normal judgment/insight. Normal mood and affect. Oriented x3.    ASSESSMENT/PLAN:   1. Annual physical exam Checking CBC with differential, CMP, and lipid panel today. - CBC with Differential/Platelet - COMPLETE METABOLIC PANEL WITH GFR - Lipid panel  2. Thyroid mass Checking TSH today. - TSH  3. Diabetes mellitus screening Checking hemoglobin A1c. - Hemoglobin A1c   Orders Placed This Encounter  Procedures   Varicella-zoster vaccine IM   Flu Vaccine QUAD 37moIM (Fluarix, Fluzone & Alfiuria Quad PF)   CBC with Differential/Platelet   COMPLETE METABOLIC PANEL WITH GFR   Lipid panel   TSH   Hemoglobin A1c    No orders of the defined types were placed in this encounter.   Patient Instructions  Preventive Care [id number] Old, Female Preventive care refers to lifestyle choices and visits with your health care provider that can promote health and wellness. This includes: A yearly physical exam. This is also called an annual wellness visit. Regular dental and eye exams. Immunizations. Screening for certain conditions. Healthy lifestyle choices, such as: Eating a healthy diet. Getting regular exercise. Not using drugs or products that contain nicotine and tobacco. Limiting alcohol use. What can I expect for my preventive care visit? Physical exam Your health care provider will check your: Height and weight. These may be used to calculate your BMI (body mass index). BMI is a measurement that tells if you are at a healthy weight. Heart rate and blood pressure. Body temperature. Skin for abnormal spots. Counseling Your health care provider may ask you questions about your: Past medical problems. Family's medical history. Alcohol, tobacco, and drug use. Emotional well-being. Home life and  relationship well-being. Sexual activity. Diet, exercise, and sleep habits. Work and work [employer]istician Access to firearms. Method of birth control. Menstrual cycle. Pregnancy history. What immunizations do I need?  Vaccines are usually given at various ages, according to a schedule. Your health care provider will recommend vaccines for you based on your age, medicalhistory, and lifestyle or other factors, such as travel or where you work. What tests do I need? Blood tests Lipid and cholesterol levels. These may be checked every 5 years, or more often if you are over 560years old. Hepatitis C test. Hepatitis B test. Screening Lung cancer screening. You may have this screening every year starting at age 25883if you have a 30-pack-year history of smoking and currently smoke or have quit within the past 15 years. Colorectal cancer screening. All adults should have this screening starting at age 25825and continuing until age 61 Your health care provider may recommend screening at age 3570if you are at increased risk. You will have tests every 1-10 years, depending on your results and the type of screening test. Diabetes screening. This is done by checking your blood sugar (glucose) after you have not eaten for a while (fasting). You may have this done every 1-3 years. Mammogram. This may be done every 1-2 years. Talk with your health care provider about when you should start having regular mammograms. This may depend on whether you have a family history of breast cancer. BRCA-related cancer screening. This  may be done if you have a family history of breast, ovarian, tubal, or peritoneal cancers. Pelvic exam and Pap test. This may be done every 3 years starting at age 43. Starting at age 49, this may be done every 5 years if you have a Pap test in combination with an HPV test. Other tests STD (sexually transmitted disease) testing, if you are at risk. Bone density scan. This is done to screen  for osteoporosis. You may have this scan if you are at high risk for osteoporosis. Talk with your health care provider about your test results, treatment options,and if necessary, the need for more tests. Follow these instructions at home: Eating and drinking  Eat a diet that includes fresh fruits and vegetables, whole grains, lean protein, and low-fat dairy products. Take vitamin and mineral supplements as recommended by your health care provider. Do not drink alcohol if: Your health care provider tells you not to drink. You are pregnant, may be pregnant, or are planning to become pregnant. If you drink alcohol: Limit how much you have to 0-1 drink a day. Be aware of how much alcohol is in your drink. In the [place] one drink equals one 12 oz bottle of beer (355 mL), one 5 oz glass of wine (148 mL), or one 1 oz glass of hard liquor (44 mL).  Lifestyle Take daily care of your teeth and gums. Brush your teeth every morning and night with fluoride toothpaste. Floss one time each day. Stay active. Exercise for at least 30 minutes 5 or more days each week. Do not use any products that contain nicotine or tobacco, such as cigarettes, e-cigarettes, and chewing tobacco. If you need help quitting, ask your health care provider. Do not use drugs. If you are sexually active, practice safe sex. Use a condom or other form of protection to prevent STIs (sexually transmitted infections). If you do not wish to become pregnant, use a form of birth control. If you plan to become pregnant, see your health care provider for a prepregnancy visit. If told by your health care provider, take low-dose aspirin daily starting at age 47. Find healthy ways to cope with stress, such as: Meditation, yoga, or listening to music. Journaling. Talking to a trusted person. Spending time with friends and family. Safety Always wear your seat belt while driving or riding in a vehicle. Do not drive: If you have been drinking  alcohol. Do not ride with someone who has been drinking. When you are tired or distracted. While texting. Wear a helmet and other protective equipment during sports activities. If you have firearms in your house, make sure you follow all gun safety procedures. What's next? Visit your health care provider once a year for an annual wellness visit. Ask your health care provider how often you should have your eyes and teeth checked. Stay up to date on all vaccines. This information is not intended to replace advice given to you by your health care provider. Make sure you discuss any questions you have with your healthcare provider. Document Revised: 03/13/2020 Document Reviewed: 02/18/2018 Elsevier Patient Education  [place].   Influenza (Flu) Vaccine (Inactivated or Recombinant): What You Need to Know 1. Why get vaccinated? Influenza vaccine can prevent influenza (flu). Flu is a contagious disease that spreads around the [place] every year, usually between October and May. Anyone can get the flu, but it is more dangerous for some people. Infants and young children, people 11 years and older, pregnant  people, and people with certain health conditions or a weakenedimmune system are at greatest risk of flu complications. Pneumonia, bronchitis, sinus infections, and ear infections are examples of flu-related complications. If you have a medical condition, such as heartdisease, cancer, or diabetes, flu can make it worse. Flu can cause fever and chills, sore throat, muscle aches, fatigue, cough, headache, and runny or stuffy nose. Some people may have vomiting and diarrhea,though this is more common in children than adults. In an average year, thousands of people in the [place] die from flu, and many more are hospitalized. Flu vaccine prevents millions of illnessesand flu-related visits to the doctor each year. 2. Influenza vaccines CDC recommends everyone 6 months and older get  vaccinated every flu season. Children 6 months through 22 years of age may need 2 doses during a single flu season. Everyone else needs only 1 dose each flu season. It takes about 2 weeks for protection to develop after vaccination. There are many flu viruses, and they are always changing. Each year a new flu vaccine is made to protect against the influenza viruses believed to be likely to cause disease in the upcoming flu season. Even when the vaccine doesn'texactly match these viruses, it may still provide some protection. Influenza vaccine does not cause flu. Influenza vaccine may be given at the same time as other vaccines. 3. Talk with your health care provider Tell your vaccination provider if the person getting the vaccine: Has had an allergic reaction after a previous dose of influenza vaccine, or has any severe, life-threatening allergies Has ever had Guillain-Barr Syndrome (also called "GBS") In some cases, your health care provider may decide to postpone influenzavaccination until a future visit. Influenza vaccine can be administered at any time during pregnancy. People who are or will be pregnant during influenza season should receive inactivatedinfluenza vaccine. People with minor illnesses, such as a cold, may be vaccinated. People who are moderately or severely ill should usually wait until they recover beforegetting influenza vaccine. Your health care provider can give you more information. 4. Risks of a vaccine reaction Soreness, redness, and swelling where the shot is given, fever, muscle aches, and headache can happen after influenza vaccination. There may be a very small increased risk of Guillain-Barr Syndrome (GBS) after inactivated influenza vaccine (the flu shot). Young children who get the flu shot along with pneumococcal vaccine (PCV13) and/or DTaP vaccine at the same time might be slightly more likely to have a seizure caused by fever. Tell your health care provider if a  child who isgetting flu vaccine has ever had a seizure. People sometimes faint after medical procedures, including vaccination. Tellyour provider if you feel dizzy or have vision changes or ringing in the ears. As with any medicine, there is a very remote chance of a vaccine causing asevere allergic reaction, other serious injury, or death. 5. What if there is a serious problem? An allergic reaction could occur after the vaccinated person leaves the clinic. If you see signs of a severe allergic reaction (hives, swelling of the face and throat, difficulty breathing, a fast heartbeat, dizziness, or weakness), call 9-1-1 and get the person to the nearest hospital. For other signs that concern you, call your health care provider. Adverse reactions should be reported to the Vaccine Adverse Event Reporting System (VAERS). Your health care provider will usually file this report, or you can do it yourself. Visit the VAERS website at www.vaers.SamedayNews.es or call (463) 707-8305. VAERS is only for reporting reactions, and VAERS  staff members do not give medical advice. 6. The National Vaccine Injury Compensation Program The [employer] Vaccine Injury Compensation Program (VICP) is a federal program that was created to compensate people who may have been injured by certain vaccines. Claims regarding alleged injury or death due to vaccination have a time limit for filing, which may be as short as two years. Visit the VICP website at GoldCloset.com.ee or call 626-545-6718 to learn about the program and about filing a claim. 7. How can I learn more? Ask your health care provider. Call your local or state health department. Visit the website of the Food and Drug Administration (FDA) for vaccine package inserts and additional information at TraderRating.uy. Contact the Centers for Disease Control and Prevention (CDC): Call 561-243-9904 (1-800-CDC-INFO) or Visit CDC's website  at https://gibson.com/. Vaccine Information Statement Inactivated Influenza Vaccine (01/27/2020) This information is not intended to replace advice given to you by your health care provider. Make sure you discuss any questions you have with your healthcare provider. Document Revised: 03/15/2020 Document Reviewed: 03/15/2020 Elsevier Patient Education  [place]. Recombinant Zoster (Shingles) Vaccine: What You Need to Know 1. Why get vaccinated? Recombinant zoster (shingles) vaccine can prevent shingles. Shingles (also called herpes zoster, or just zoster) is a painful skin rash, usually with blisters. In addition to the rash, shingles can cause fever, headache, chills, or upset stomach. More rarely, shingles can lead to pneumonia, hearingproblems, blindness, brain inflammation (encephalitis), or death. The most common complication of shingles is long-term nerve pain called postherpetic neuralgia (PHN). PHN occurs in the areas where the shingles rash was, even after the rash clears up. It can last for months or years after therash goes away. The pain from PHN can be severe and debilitating. About 10 to 18% of people who get shingles will experience PHN. The risk of PHN increases with age. An older adult with shingles is more likely to develop PHN and have longer lasting and more severe pain than a younger person withshingles. Shingles is caused by the varicella zoster virus, the same virus that causes chickenpox. After you have chickenpox, the virus stays in your body and can cause shingles later in life. Shingles cannot be passed from one person to another, but the virus that causes shingles can spread and cause chickenpox insomeone who had never had chickenpox or received chickenpox vaccine. 2. Recombinant shingles vaccine Recombinant shingles vaccine provides strong protection against shingles. Bypreventing shingles, recombinant shingles vaccine also protects against PHN. Recombinant shingles  vaccine is the preferred vaccine for the prevention of shingles. However, a different vaccine, live shingles vaccine, may be used in somecircumstances. The recombinant shingles vaccine is recommended for adults 50 years and older without serious immune problems. It is given as a two-dose series. This vaccine is also recommended for people who have already gotten another type of shingles vaccine, the live shingles vaccine. There is no live virus inthis vaccine. Shingles vaccine may be given at the same time as other vaccines. 3. Talk with your health care provider Tell your vaccine provider if the person getting the vaccine: Has had an allergic reaction after a previous dose of recombinant shingles vaccine, or has any severe, life-threatening allergies. Is pregnant or breastfeeding. Is currently experiencing an episode of shingles. In some cases, your health care provider may decide to postpone shinglesvaccination to a future visit. People with minor illnesses, such as a cold, may be vaccinated. People who are moderately or severely ill should usually wait until they recover beforegetting recombinant  shingles vaccine. Your health care provider can give you more information. 4. Risks of a vaccine reaction A sore arm with mild or moderate pain is very common after recombinant shingles vaccine, affecting about 80% of vaccinated people. Redness and swelling can also happen at the site of the injection. Tiredness, muscle pain, headache, shivering, fever, stomach pain, and nausea happen after vaccination in more than half of people who receive recombinant shingles vaccine. In clinical trials, about 1 out of 6 people who got recombinant zoster vaccine experienced side effects that prevented them from doing regular activities.Symptoms usually went away on their own in 2 to 3 days. You should still get the second dose of recombinant zoster vaccine even if youhad one of these reactions after the first  dose. People sometimes faint after medical procedures, including vaccination. Tellyour provider if you feel dizzy or have vision changes or ringing in the ears. As with any medicine, there is a very remote chance of a vaccine causing asevere allergic reaction, other serious injury, or death. 5. What if there is a serious problem? An allergic reaction could occur after the vaccinated person leaves the clinic. If you see signs of a severe allergic reaction (hives, swelling of the face and throat, difficulty breathing, a fast heartbeat, dizziness, or weakness), call 9-1-1 and get the person to the nearest hospital. For other signs that concern you, call your health care provider. Adverse reactions should be reported to the Vaccine Adverse Event Reporting System (VAERS). Your health care provider will usually file this report, or you can do it yourself. Visit the VAERS website at www.vaers.SamedayNews.es or call 575 861 3351. VAERS is only for reporting reactions, and VAERS staff do not give medical advice. 6. How can I learn more? Ask your health care provider. Call your local or state health department. Contact the Centers for Disease Control and Prevention (CDC): Call 971 662 6008 (1-800-CDC-INFO) or Visit CDC's website at http://hunter.com/ Vaccine Information Statement Recombinant Zoster Vaccine (04/21/2018) This information is not intended to replace advice given to you by your health care provider. Make sure you discuss any questions you have with your healthcare provider. Document Revised: 02/10/2020 Document Reviewed: 02/10/2020 Elsevier Patient Education  [place].  Follow-up plan: Return in about 1 year (around 02/18/2022) for annual physical exam.  Clearnce Sorrel, DNP, APRN, FNP-BC [facility]

## 2021-02-18 NOTE — Patient Instructions (Addendum)
Preventive Care 68-61 Years Old, Female Preventive care refers to lifestyle choices and visits with your health care provider that can promote health and wellness. This includes: A yearly physical exam. This is also called an annual wellness visit. Regular dental and eye exams. Immunizations. Screening for certain conditions. Healthy lifestyle choices, such as: Eating a healthy diet. Getting regular exercise. Not using drugs or products that contain nicotine and tobacco. Limiting alcohol use. What can I expect for my preventive care visit? Physical exam Your health care provider will check your: Height and weight. These may be used to calculate your BMI (body mass index). BMI is a measurement that tells if you are at a healthy weight. Heart rate and blood pressure. Body temperature. Skin for abnormal spots. Counseling Your health care provider may ask you questions about your: Past medical problems. Family's medical history. Alcohol, tobacco, and drug use. Emotional well-being. Home life and relationship well-being. Sexual activity. Diet, exercise, and sleep habits. Work and work Statistician. Access to firearms. Method of birth control. Menstrual cycle. Pregnancy history. What immunizations do I need?  Vaccines are usually given at various ages, according to a schedule. Your health care provider will recommend vaccines for you based on your age, medicalhistory, and lifestyle or other factors, such as travel or where you work. What tests do I need? Blood tests Lipid and cholesterol levels. These may be checked every 5 years, or more often if you are over 37 years old. Hepatitis C test. Hepatitis B test. Screening Lung cancer screening. You may have this screening every year starting at age 30 if you have a 30-pack-year history of smoking and currently smoke or have quit within the past 15 years. Colorectal cancer screening. All adults should have this screening starting at  age 23 and continuing until age 3. Your health care provider may recommend screening at age 88 if you are at increased risk. You will have tests every 1-10 years, depending on your results and the type of screening test. Diabetes screening. This is done by checking your blood sugar (glucose) after you have not eaten for a while (fasting). You may have this done every 1-3 years. Mammogram. This may be done every 1-2 years. Talk with your health care provider about when you should start having regular mammograms. This may depend on whether you have a family history of breast cancer. BRCA-related cancer screening. This may be done if you have a family history of breast, ovarian, tubal, or peritoneal cancers. Pelvic exam and Pap test. This may be done every 3 years starting at age 79. Starting at age 54, this may be done every 5 years if you have a Pap test in combination with an HPV test. Other tests STD (sexually transmitted disease) testing, if you are at risk. Bone density scan. This is done to screen for osteoporosis. You may have this scan if you are at high risk for osteoporosis. Talk with your health care provider about your test results, treatment options,and if necessary, the need for more tests. Follow these instructions at home: Eating and drinking  Eat a diet that includes fresh fruits and vegetables, whole grains, lean protein, and low-fat dairy products. Take vitamin and mineral supplements as recommended by your health care provider. Do not drink alcohol if: Your health care provider tells you not to drink. You are pregnant, may be pregnant, or are planning to become pregnant. If you drink alcohol: Limit how much you have to 0-1 drink a day. Be aware  of how much alcohol is in your drink. In the [place] one drink equals one 12 oz bottle of beer (355 mL), one 5 oz glass of wine (148 mL), or one 1 oz glass of hard liquor (44 mL).  Lifestyle Take daily care of your teeth and  gums. Brush your teeth every morning and night with fluoride toothpaste. Floss one time each day. Stay active. Exercise for at least 30 minutes 5 or more days each week. Do not use any products that contain nicotine or tobacco, such as cigarettes, e-cigarettes, and chewing tobacco. If you need help quitting, ask your health care provider. Do not use drugs. If you are sexually active, practice safe sex. Use a condom or other form of protection to prevent STIs (sexually transmitted infections). If you do not wish to become pregnant, use a form of birth control. If you plan to become pregnant, see your health care provider for a prepregnancy visit. If told by your health care provider, take low-dose aspirin daily starting at age 79. Find healthy ways to cope with stress, such as: Meditation, yoga, or listening to music. Journaling. Talking to a trusted person. Spending time with friends and family. Safety Always wear your seat belt while driving or riding in a vehicle. Do not drive: If you have been drinking alcohol. Do not ride with someone who has been drinking. When you are tired or distracted. While texting. Wear a helmet and other protective equipment during sports activities. If you have firearms in your house, make sure you follow all gun safety procedures. What's next? Visit your health care provider once a year for an annual wellness visit. Ask your health care provider how often you should have your eyes and teeth checked. Stay up to date on all vaccines. This information is not intended to replace advice given to you by your health care provider. Make sure you discuss any questions you have with your healthcare provider. Document Revised: 03/13/2020 Document Reviewed: 02/18/2018 Elsevier Patient Education  [place].   Influenza (Flu) Vaccine (Inactivated or Recombinant): What You Need to Know 1. Why get vaccinated? Influenza vaccine can prevent influenza (flu). Flu  is a contagious disease that spreads around the [place] every year, usually between October and May. Anyone can get the flu, but it is more dangerous for some people. Infants and young children, people 70 years and older, pregnant people, and people with certain health conditions or a weakenedimmune system are at greatest risk of flu complications. Pneumonia, bronchitis, sinus infections, and ear infections are examples of flu-related complications. If you have a medical condition, such as heartdisease, cancer, or diabetes, flu can make it worse. Flu can cause fever and chills, sore throat, muscle aches, fatigue, cough, headache, and runny or stuffy nose. Some people may have vomiting and diarrhea,though this is more common in children than adults. In an average year, thousands of people in the [place] die from flu, and many more are hospitalized. Flu vaccine prevents millions of illnessesand flu-related visits to the doctor each year. 2. Influenza vaccines CDC recommends everyone 6 months and older get vaccinated every flu season. Children 6 months through 60 years of age may need 2 doses during a single flu season. Everyone else needs only 1 dose each flu season. It takes about 2 weeks for protection to develop after vaccination. There are many flu viruses, and they are always changing. Each year a new flu vaccine is made to protect against the influenza viruses believed  to be likely to cause disease in the upcoming flu season. Even when the vaccine doesn'texactly match these viruses, it may still provide some protection. Influenza vaccine does not cause flu. Influenza vaccine may be given at the same time as other vaccines. 3. Talk with your health care provider Tell your vaccination provider if the person getting the vaccine: Has had an allergic reaction after a previous dose of influenza vaccine, or has any severe, life-threatening allergies Has ever had Guillain-Barr Syndrome (also  called "GBS") In some cases, your health care provider may decide to postpone influenzavaccination until a future visit. Influenza vaccine can be administered at any time during pregnancy. People who are or will be pregnant during influenza season should receive inactivatedinfluenza vaccine. People with minor illnesses, such as a cold, may be vaccinated. People who are moderately or severely ill should usually wait until they recover beforegetting influenza vaccine. Your health care provider can give you more information. 4. Risks of a vaccine reaction Soreness, redness, and swelling where the shot is given, fever, muscle aches, and headache can happen after influenza vaccination. There may be a very small increased risk of Guillain-Barr Syndrome (GBS) after inactivated influenza vaccine (the flu shot). Young children who get the flu shot along with pneumococcal vaccine (PCV13) and/or DTaP vaccine at the same time might be slightly more likely to have a seizure caused by fever. Tell your health care provider if a child who isgetting flu vaccine has ever had a seizure. People sometimes faint after medical procedures, including vaccination. Tellyour provider if you feel dizzy or have vision changes or ringing in the ears. As with any medicine, there is a very remote chance of a vaccine causing asevere allergic reaction, other serious injury, or death. 5. What if there is a serious problem? An allergic reaction could occur after the vaccinated person leaves the clinic. If you see signs of a severe allergic reaction (hives, swelling of the face and throat, difficulty breathing, a fast heartbeat, dizziness, or weakness), call 9-1-1 and get the person to the nearest hospital. For other signs that concern you, call your health care provider. Adverse reactions should be reported to the Vaccine Adverse Event Reporting System (VAERS). Your health care provider will usually file this report, or you can do it  yourself. Visit the VAERS website at www.vaers.SamedayNews.es or call 2810374198. VAERS is only for reporting reactions, and VAERS staff members do not give medical advice. 6. The National Vaccine Injury Compensation Program The [employer] Vaccine Injury Compensation Program (VICP) is a federal program that was created to compensate people who may have been injured by certain vaccines. Claims regarding alleged injury or death due to vaccination have a time limit for filing, which may be as short as two years. Visit the VICP website at GoldCloset.com.ee or call (865)020-2762 to learn about the program and about filing a claim. 7. How can I learn more? Ask your health care provider. Call your local or state health department. Visit the website of the Food and Drug Administration (FDA) for vaccine package inserts and additional information at TraderRating.uy. Contact the Centers for Disease Control and Prevention (CDC): Call (609)041-0972 (1-800-CDC-INFO) or Visit CDC's website at https://gibson.com/. Vaccine Information Statement Inactivated Influenza Vaccine (01/27/2020) This information is not intended to replace advice given to you by your health care provider. Make sure you discuss any questions you have with your healthcare provider. Document Revised: 03/15/2020 Document Reviewed: 03/15/2020 Elsevier Patient Education  [place]. Recombinant Zoster (Shingles) Vaccine: What  You Need to Know 1. Why get vaccinated? Recombinant zoster (shingles) vaccine can prevent shingles. Shingles (also called herpes zoster, or just zoster) is a painful skin rash, usually with blisters. In addition to the rash, shingles can cause fever, headache, chills, or upset stomach. More rarely, shingles can lead to pneumonia, hearingproblems, blindness, brain inflammation (encephalitis), or death. The most common complication of shingles is long-term nerve pain called  postherpetic neuralgia (PHN). PHN occurs in the areas where the shingles rash was, even after the rash clears up. It can last for months or years after therash goes away. The pain from PHN can be severe and debilitating. About 10 to 18% of people who get shingles will experience PHN. The risk of PHN increases with age. An older adult with shingles is more likely to develop PHN and have longer lasting and more severe pain than a younger person withshingles. Shingles is caused by the varicella zoster virus, the same virus that causes chickenpox. After you have chickenpox, the virus stays in your body and can cause shingles later in life. Shingles cannot be passed from one person to another, but the virus that causes shingles can spread and cause chickenpox insomeone who had never had chickenpox or received chickenpox vaccine. 2. Recombinant shingles vaccine Recombinant shingles vaccine provides strong protection against shingles. Bypreventing shingles, recombinant shingles vaccine also protects against PHN. Recombinant shingles vaccine is the preferred vaccine for the prevention of shingles. However, a different vaccine, live shingles vaccine, may be used in somecircumstances. The recombinant shingles vaccine is recommended for adults 50 years and older without serious immune problems. It is given as a two-dose series. This vaccine is also recommended for people who have already gotten another type of shingles vaccine, the live shingles vaccine. There is no live virus inthis vaccine. Shingles vaccine may be given at the same time as other vaccines. 3. Talk with your health care provider Tell your vaccine provider if the person getting the vaccine: Has had an allergic reaction after a previous dose of recombinant shingles vaccine, or has any severe, life-threatening allergies. Is pregnant or breastfeeding. Is currently experiencing an episode of shingles. In some cases, your health care provider may  decide to postpone shinglesvaccination to a future visit. People with minor illnesses, such as a cold, may be vaccinated. People who are moderately or severely ill should usually wait until they recover beforegetting recombinant shingles vaccine. Your health care provider can give you more information. 4. Risks of a vaccine reaction A sore arm with mild or moderate pain is very common after recombinant shingles vaccine, affecting about 80% of vaccinated people. Redness and swelling can also happen at the site of the injection. Tiredness, muscle pain, headache, shivering, fever, stomach pain, and nausea happen after vaccination in more than half of people who receive recombinant shingles vaccine. In clinical trials, about 1 out of 6 people who got recombinant zoster vaccine experienced side effects that prevented them from doing regular activities.Symptoms usually went away on their own in 2 to 3 days. You should still get the second dose of recombinant zoster vaccine even if youhad one of these reactions after the first dose. People sometimes faint after medical procedures, including vaccination. Tellyour provider if you feel dizzy or have vision changes or ringing in the ears. As with any medicine, there is a very remote chance of a vaccine causing asevere allergic reaction, other serious injury, or death. 5. What if there is a serious problem? An allergic reaction could occur  after the vaccinated person leaves the clinic. If you see signs of a severe allergic reaction (hives, swelling of the face and throat, difficulty breathing, a fast heartbeat, dizziness, or weakness), call 9-1-1 and get the person to the nearest hospital. For other signs that concern you, call your health care provider. Adverse reactions should be reported to the Vaccine Adverse Event Reporting System (VAERS). Your health care provider will usually file this report, or you can do it yourself. Visit the VAERS website at  www.vaers.SamedayNews.es or call 367-059-1479. VAERS is only for reporting reactions, and VAERS staff do not give medical advice. 6. How can I learn more? Ask your health care provider. Call your local or state health department. Contact the Centers for Disease Control and Prevention (CDC): Call 765-156-3834 (1-800-CDC-INFO) or Visit CDC's website at http://hunter.com/ Vaccine Information Statement Recombinant Zoster Vaccine (04/21/2018) This information is not intended to replace advice given to you by your health care provider. Make sure you discuss any questions you have with your healthcare provider. Document Revised: 02/10/2020 Document Reviewed: 02/10/2020 Elsevier Patient Education  [place].

## 2021-02-21 LAB — HEMOGLOBIN A1C
Hgb A1c MFr Bld: 5.2 % of total Hgb (ref <5.7)
Mean Plasma Glucose: 103 mg/dL
eAG (mmol/L): 5.7 mmol/L

## 2021-02-21 LAB — CBC WITH DIFFERENTIAL/PLATELET
Absolute Monocytes: 374 cells/uL (ref 200–950)
Basophils Absolute: 27 cells/uL (ref 0–200)
Basophils Relative: 0.4 %
Eosinophils Absolute: 68 cells/uL (ref 15–500)
Eosinophils Relative: 1 %
HCT: 38.6 % (ref 35.0–45.0)
Hemoglobin: 12.6 g/dL (ref 11.7–15.5)
Lymphs Abs: 1788 cells/uL (ref 850–3900)
MCH: 28.8 pg (ref 27.0–33.0)
MCHC: 32.6 g/dL (ref 32.0–36.0)
MCV: 88.1 fL (ref 80.0–100.0)
MPV: 9.5 fL (ref 7.5–12.5)
Monocytes Relative: 5.5 %
Neutro Abs: 4542 cells/uL (ref 1500–7800)
Neutrophils Relative %: 66.8 %
Platelets: 275 10*3/uL (ref 140–400)
RBC: 4.38 10*6/uL (ref 3.80–5.10)
RDW: 12.7 % (ref 11.0–15.0)
Total Lymphocyte: 26.3 %
WBC: 6.8 10*3/uL (ref 3.8–10.8)

## 2021-02-21 LAB — COMPLETE METABOLIC PANEL WITH GFR
AG Ratio: 1.3 (calc) (ref 1.0–2.5)
ALT: 15 U/L (ref 6–29)
AST: 17 U/L (ref 10–35)
Albumin: 3.8 g/dL (ref 3.6–5.1)
Alkaline phosphatase (APISO): 84 U/L (ref 37–153)
BUN: 20 mg/dL (ref 7–25)
CO2: 31 mmol/L (ref 20–32)
Calcium: 9.1 mg/dL (ref 8.6–10.4)
Chloride: 102 mmol/L (ref 98–110)
Creat: 0.62 mg/dL (ref 0.50–1.05)
Globulin: 2.9 g/dL (calc) (ref 1.9–3.7)
Glucose, Bld: 91 mg/dL (ref 65–99)
Potassium: 4.4 mmol/L (ref 3.5–5.3)
Sodium: 140 mmol/L (ref 135–146)
Total Bilirubin: 0.2 mg/dL (ref 0.2–1.2)
Total Protein: 6.7 g/dL (ref 6.1–8.1)
eGFR: 101 mL/min/{1.73_m2} (ref >=60)

## 2021-02-21 LAB — LIPID PANEL
Cholesterol: 201 mg/dL — ABNORMAL HIGH (ref <200)
HDL: 77 mg/dL (ref >=50)
LDL Cholesterol (Calc): 100 mg/dL (calc) — ABNORMAL HIGH
Non-HDL Cholesterol (Calc): 124 mg/dL (calc) (ref <130)
Total CHOL/HDL Ratio: 2.6 (calc) (ref <5.0)
Triglycerides: 144 mg/dL (ref <150)

## 2021-02-21 LAB — T3, FREE: T3, Free: 3.4 pg/mL (ref 2.3–4.2)

## 2021-02-21 LAB — T3: T3, Total: 120 ng/dL (ref 76–181)

## 2021-02-21 LAB — T4, FREE: Free T4: 1.1 ng/dL (ref 0.8–1.8)

## 2021-02-21 LAB — TSH: TSH: 0.26 mIU/L — ABNORMAL LOW (ref 0.40–4.50)

## 2021-04-03 ENCOUNTER — Encounter: Payer: Self-pay | Admitting: *Deleted

## 2021-05-21 ENCOUNTER — Encounter: Payer: Self-pay | Admitting: Medical-Surgical

## 2021-06-20 ENCOUNTER — Other Ambulatory Visit: Payer: Self-pay

## 2021-06-20 ENCOUNTER — Ambulatory Visit (INDEPENDENT_AMBULATORY_CARE_PROVIDER_SITE_OTHER): Payer: No Typology Code available for payment source | Admitting: Medical-Surgical

## 2021-06-20 VITALS — Temp 97.6°F

## 2021-06-20 DIAGNOSIS — Z23 Encounter for immunization: Secondary | ICD-10-CM

## 2021-06-20 NOTE — Progress Notes (Signed)
Established Patient Office Visit  Subjective:  Patient ID: Kylie Flores, female    DOB: 12/20/1960  Age: 60 y.o. MRN: [id number]  CC:  Chief Complaint  Patient presents with   Immunizations    HPI Kylie Flores presents for Shingles vaccine.   Past Medical History:  Diagnosis Date   Abnormal mammogram    Colon polyps    History of blood transfusion    Obesity (BMI 30.0-34.9)    Pneumonia    Thyroid disease    Tuberculosis     Past Surgical History:  Procedure Laterality Date   ANGIOPLASTY     BREAST LUMPECTOMY     CHOLECYSTECTOMY     TOTAL ABDOMINAL HYSTERECTOMY      Family History  Problem Relation Age of Onset   Diabetes Maternal Grandfather    Colon cancer Maternal Grandfather    Skin cancer Mother     Social History   Socioeconomic History   Marital status: Divorced    Spouse name: Not on file   Number of children: Not on file   Years of education: Not on file   Highest education level: Not on file  Occupational History   Occupation: adm Customer service manager: [employer]  Tobacco Use   Smoking status: Never   Smokeless tobacco: Never  Vaping Use   Vaping Use: Never used  Substance and Sexual Activity   Alcohol use: Yes    Comment: rarely   Drug use: Never   Sexual activity: Not Currently    Partners: Male  Other Topics Concern   Not on file  Social History Narrative   Not on file   Social Determinants of Health   Financial Resource Strain: Not on file  Food Insecurity: Not on file  Transportation Needs: Not on file  Physical Activity: Not on file  Stress: Not on file  Social Connections: Not on file  Intimate Partner Violence: Not on file    No outpatient medications prior to visit.   No facility-administered medications prior to visit.    No Known Allergies  ROS Review of Systems    Objective:    Physical Exam  Temp 97.6 F (36.4 C) (Temporal)  Wt Readings from Last 3 Encounters:  02/18/21 216 lb 11.2 oz  (98.3 kg)  01/16/20 (!) 212 lb (96.2 kg)  01/12/20 212 lb 4.8 oz (96.3 kg)     Health Maintenance Due  Topic Date Due   COVID-19 Vaccine (3 - Booster for Pfizer series) 04/06/2020    There are no preventive care reminders to display for this patient.  Lab Results  Component Value Date   TSH 0.26 (L) 02/18/2021   Lab Results  Component Value Date   WBC 6.8 02/18/2021   HGB 12.6 02/18/2021   HCT 38.6 02/18/2021   MCV 88.1 02/18/2021   PLT 275 02/18/2021   Lab Results  Component Value Date   NA 140 02/18/2021   K 4.4 02/18/2021   CO2 31 02/18/2021   GLUCOSE 91 02/18/2021   BUN 20 02/18/2021   CREATININE 0.62 02/18/2021   BILITOT 0.2 02/18/2021   ALKPHOS 95 01/18/2020   AST 17 02/18/2021   ALT 15 02/18/2021   PROT 6.7 02/18/2021   ALBUMIN 4.2 01/18/2020   CALCIUM 9.1 02/18/2021   EGFR 101 02/18/2021   Lab Results  Component Value Date   CHOL 201 (H) 02/18/2021   Lab Results  Component Value Date   HDL 77 02/18/2021   Lab  Results  Component Value Date   LDLCALC 100 (H) 02/18/2021   Lab Results  Component Value Date   TRIG 144 02/18/2021   Lab Results  Component Value Date   CHOLHDL 2.6 02/18/2021   Lab Results  Component Value Date   HGBA1C 5.2 02/18/2021      Assessment & Plan:  Shingles vaccine - Patient tolerated injection well without complications.   Problem List Items Addressed This Visit   None Visit Diagnoses     Need for shingles vaccine    -  Primary   Relevant Orders   Varicella-zoster vaccine IM (Shingrix) (Completed)       No orders of the defined types were placed in this encounter.   Follow-up: No follow-ups on file.    Lavell Luster, [place]

## 2021-06-20 NOTE — Progress Notes (Signed)
Agree with documentation as above.  ? ?___________________________________________ ?Olanrewaju Osborn L. Myli Pae, DNP, APRN, FNP-BC ?Primary Care and Sports Medicine ?[facility] [place] ? ?

## 2021-07-23 DIAGNOSIS — E042 Nontoxic multinodular goiter: Secondary | ICD-10-CM

## 2021-07-23 HISTORY — DX: Nontoxic multinodular goiter: E04.2

## 2021-11-29 ENCOUNTER — Telehealth: Payer: Self-pay

## 2021-11-29 NOTE — Telephone Encounter (Signed)
Patient called stating she has been having lower abdominal pain/cramps that is tender to the touch and a low grade fever for 3 days. Patient requesting for appointment today. No appointments available today in our office. Patient was advised to seek evaluation today at urgent care. Patient agreed and stated she will go to urgent care near her job in [employer].

## 2022-02-25 ENCOUNTER — Encounter: Payer: No Typology Code available for payment source | Admitting: Medical-Surgical

## 2022-03-21 DIAGNOSIS — M7522 Bicipital tendinitis, left shoulder: Secondary | ICD-10-CM | POA: Insufficient documentation

## 2022-04-07 ENCOUNTER — Encounter: Payer: No Typology Code available for payment source | Admitting: Medical-Surgical

## 2022-04-18 ENCOUNTER — Ambulatory Visit (INDEPENDENT_AMBULATORY_CARE_PROVIDER_SITE_OTHER): Payer: No Typology Code available for payment source | Admitting: Physician Assistant

## 2022-04-18 ENCOUNTER — Ambulatory Visit (INDEPENDENT_AMBULATORY_CARE_PROVIDER_SITE_OTHER): Payer: No Typology Code available for payment source

## 2022-04-18 ENCOUNTER — Encounter: Payer: Self-pay | Admitting: Physician Assistant

## 2022-04-18 VITALS — BP 112/71 | HR 70 | Wt 222.0 lb

## 2022-04-18 DIAGNOSIS — I890 Lymphedema, not elsewhere classified: Secondary | ICD-10-CM | POA: Diagnosis not present

## 2022-04-18 DIAGNOSIS — M79661 Pain in right lower leg: Secondary | ICD-10-CM

## 2022-04-18 DIAGNOSIS — M79604 Pain in right leg: Secondary | ICD-10-CM

## 2022-04-18 DIAGNOSIS — M7989 Other specified soft tissue disorders: Secondary | ICD-10-CM

## 2022-04-18 MED ORDER — CEPHALEXIN 500 MG PO CAPS: 500.0000 mg | ORAL_CAPSULE | Freq: Four times a day (QID) | ORAL | 0 refills | Status: DC

## 2022-04-18 NOTE — Addendum Note (Signed)
Addended by: Teddy Spike on: 04/18/2022 03:45 PM   Modules accepted: Orders

## 2022-04-18 NOTE — Progress Notes (Signed)
   Acute Office Visit  Subjective:     Patient ID: Kylie Flores, female    DOB: March 10, 1961, 61 y.o.   MRN: [id number]  Chief Complaint  Patient presents with   Leg Swelling    HPI Patient is in today for right leg redness, pain and swelling for the last 12 hours. She woke up in the middle of the night feeling like she had fever and her right upper medial leg hurting. She took tylenol and helped a little. No SOB, wheezing, cough. No CP. She has hx of right leg lymphedema.no recent travel. No injury. She is worried about how red it looks.   .. Active Ambulatory Problems    Diagnosis Date Noted   Abnormal mammogram 10/05/2019   Lymphedema of right lower extremity 01/12/2020   Vertigo 01/12/2020   Nail abnormalities 01/12/2020   Right leg pain 04/18/2022   Pain and swelling of right lower leg 04/18/2022   Resolved Ambulatory Problems    Diagnosis Date Noted   No Resolved Ambulatory Problems   Past Medical History:  Diagnosis Date   Colon polyps    History of blood transfusion    Obesity (BMI 30.0-34.9)    Pneumonia    Thyroid disease    Tuberculosis        ROS See HPI.      Objective:    BP 112/71   Pulse 70   Wt 222 lb (100.7 kg)   SpO2 97%   BMI 37.81 kg/m  BP Readings from Last 3 Encounters:  04/18/22 112/71  02/18/21 130/81  01/12/20 121/82   Wt Readings from Last 3 Encounters:  04/18/22 222 lb (100.7 kg)  02/18/21 216 lb 11.2 oz (98.3 kg)  01/16/20 (!) 212 lb (96.2 kg)      Physical Exam Constitutional:      Appearance: Normal appearance. She is obese.  HENT:     Head: Normocephalic.  Cardiovascular:     Rate and Rhythm: Normal rate and regular rhythm.  Pulmonary:     Effort: Pulmonary effort is normal.     Breath sounds: Normal breath sounds.  Musculoskeletal:     Comments: Right upper medial leg erythematous(most of medial thigh), tender to palpation, warm and swollen. No pitting edema.   Neurological:     General: No focal deficit  present.     Mental Status: She is alert and oriented to person, place, and time.  Psychiatric:        Mood and Affect: Mood normal.          Assessment & Plan:  Marland KitchenMarland KitchenYaeko was seen today for leg swelling.  Diagnoses and all orders for this visit:  Right leg pain -     US Venous Img Lower Unilateral Right  Lymphedema of right lower extremity -     US Venous Img Lower Unilateral Right  Pain and swelling of right lower leg -     cephALEXin (KEFLEX) 500 MG capsule; Take 1 capsule (500 mg total) by mouth 4 (four) times daily. -     US Venous Img Lower Unilateral Right   Vitals look great Pulse ox came up on 2nd recheck Will get venous doppler to r/o DVT ? Thrombophlebitis HO given Start keflex for 7 days, ASA 81mg  daily, keep legs elevated, compression and warm compresses.  Follow up as needed or if symptoms change or worsen  Iran Planas, PA-C

## 2022-04-18 NOTE — Progress Notes (Signed)
No DVT. GREAT news. Continue with treatment plan in office.

## 2022-04-18 NOTE — Patient Instructions (Signed)
Start keflex for 7 days Compression/elevation/Ice Will get ultrasound Start ASA 81 daily

## 2022-06-26 ENCOUNTER — Encounter: Payer: No Typology Code available for payment source | Admitting: Medical-Surgical

## 2022-07-25 ENCOUNTER — Encounter: Payer: Self-pay | Admitting: Medical-Surgical

## 2022-07-25 ENCOUNTER — Ambulatory Visit (INDEPENDENT_AMBULATORY_CARE_PROVIDER_SITE_OTHER): Payer: No Typology Code available for payment source | Admitting: Medical-Surgical

## 2022-07-25 VITALS — BP 136/83 | HR 68 | Resp 20 | Ht 64.25 in | Wt 233.5 lb

## 2022-07-25 DIAGNOSIS — Z1329 Encounter for screening for other suspected endocrine disorder: Secondary | ICD-10-CM

## 2022-07-25 DIAGNOSIS — Z6839 Body mass index (BMI) 39.0-39.9, adult: Secondary | ICD-10-CM

## 2022-07-25 DIAGNOSIS — Z0000 Encounter for general adult medical examination without abnormal findings: Secondary | ICD-10-CM

## 2022-07-25 DIAGNOSIS — K219 Gastro-esophageal reflux disease without esophagitis: Secondary | ICD-10-CM

## 2022-07-25 DIAGNOSIS — Z131 Encounter for screening for diabetes mellitus: Secondary | ICD-10-CM | POA: Diagnosis not present

## 2022-07-25 DIAGNOSIS — Z13220 Encounter for screening for lipoid disorders: Secondary | ICD-10-CM | POA: Diagnosis not present

## 2022-07-25 DIAGNOSIS — E6609 Other obesity due to excess calories: Secondary | ICD-10-CM

## 2022-07-25 MED ORDER — PANTOPRAZOLE SODIUM 40 MG PO TBEC: 40.0000 mg | DELAYED_RELEASE_TABLET | Freq: Every day | ORAL | 3 refills | Status: DC

## 2022-07-25 MED ORDER — MELOXICAM 15 MG PO TABS: 15.0000 mg | ORAL_TABLET | Freq: Every day | ORAL | 0 refills | Status: DC

## 2022-07-25 NOTE — Progress Notes (Signed)
Complete physical exam  Patient: Kylie Flores   DOB: December 25, 1960   62 y.o. Female  MRN: [id number]  Subjective:    Chief Complaint  Patient presents with   Annual Exam    Kylie Flores is a 62 y.o. female who presents today for a complete physical exam. She reports consuming a general diet.  Recently started walking on the treadmill last week.   She generally feels well. She reports sleeping fairly well. She does not have additional problems to discuss today.    Most recent fall risk assessment:    07/25/2022    3:52 PM  [place] in the past year? 0  Number falls in past yr: 0  Injury with Fall? 0  Risk for fall due to : No Fall Risks  Follow up Falls evaluation completed     Most recent depression screenings:    07/25/2022    3:52 PM 04/18/2022    2:53 PM  PHQ 2/9 Scores  PHQ - 2 Score 0 0  PHQ- 9 Score  0    Vision:Within last year and Dental: No current dental problems and Receives regular dental care    Patient Care Team: Samuel Bouche, NP as PCP - General (Nurse Practitioner)   Outpatient Medications Prior to Visit  Medication Sig   Multiple Vitamins-Minerals (MULTIVITAMIN ADULTS PO) Take by mouth.   [DISCONTINUED] cephALEXin (KEFLEX) 500 MG capsule Take 1 capsule (500 mg total) by mouth 4 (four) times daily.   No facility-administered medications prior to visit.    Review of Systems  Constitutional:  Negative for chills, fever, malaise/fatigue and weight loss.  HENT:  Negative for congestion, ear pain, hearing loss, sinus pain and sore throat.   Eyes:  Negative for blurred vision, photophobia and pain.  Respiratory:  Negative for cough, shortness of breath and wheezing.   Cardiovascular:  Positive for leg swelling. Negative for chest pain and palpitations.  Gastrointestinal:  Positive for heartburn. Negative for abdominal pain, constipation, diarrhea, nausea and vomiting.  Genitourinary:  Negative for dysuria, frequency and urgency.   Musculoskeletal:  Negative for falls and neck pain.  Skin:  Negative for itching and rash.  Neurological:  Negative for dizziness, weakness and headaches.  Endo/Heme/Allergies:  Negative for polydipsia. Does not bruise/bleed easily.  Psychiatric/Behavioral:  Negative for depression, substance abuse and suicidal ideas. The patient is not nervous/anxious and does not have insomnia.      Objective:    BP 136/83 (BP Location: Right Arm, Cuff Size: Large)   Pulse 68   Resp 20   Ht 5' 4.25" (1.632 m)   Wt 233 lb 8 oz (105.9 kg)   SpO2 97%   BMI 39.77 kg/m    Physical Exam Constitutional:      General: She is not in acute distress.    Appearance: Normal appearance. She is not ill-appearing.  HENT:     Head: Normocephalic and atraumatic.     Right Ear: Tympanic membrane normal.     Left Ear: Tympanic membrane normal.     Nose: Nose normal.     Mouth/Throat:     Mouth: Mucous membranes are moist.     Pharynx: No oropharyngeal exudate or posterior oropharyngeal erythema.  Eyes:     Extraocular Movements: Extraocular movements intact.     Conjunctiva/sclera: Conjunctivae normal.     Pupils: Pupils are equal, round, and reactive to light.  Neck:     Thyroid: No thyromegaly.     Vascular: No  carotid bruit or JVD.     Trachea: Trachea normal.  Cardiovascular:     Rate and Rhythm: Normal rate and regular rhythm.     Pulses: Normal pulses.     Heart sounds: Normal heart sounds. No murmur heard.    No friction rub. No gallop.  Pulmonary:     Effort: Pulmonary effort is normal. No respiratory distress.     Breath sounds: Normal breath sounds. No wheezing.  Abdominal:     General: Bowel sounds are normal. There is no distension.     Palpations: Abdomen is soft.     Tenderness: There is no abdominal tenderness. There is no guarding.  Musculoskeletal:        General: Normal range of motion.     Cervical back: Normal range of motion and neck supple.     Right lower leg: Edema  present.     Left lower leg: No edema.  Skin:    General: Skin is warm and dry.  Neurological:     Mental Status: She is alert and oriented to person, place, and time.     Cranial Nerves: No cranial nerve deficit.  Psychiatric:        Mood and Affect: Mood normal.        Behavior: Behavior normal.        Thought Content: Thought content normal.        Judgment: Judgment normal.   No results found for any visits on 07/25/22.     Assessment & Plan:    Routine Health Maintenance and Physical Exam  Immunization History  Administered Date(s) Administered   Influenza,inj,Quad PF,6+ Mos 02/18/2021   Influenza,inj,quad, With Preservative 03/30/2019   Influenza-Unspecified 03/30/2019   PFIZER(Purple Top)SARS-COV-2 Vaccination 01/20/2020, 02/10/2020   Tdap 01/12/2020   Zoster Recombinat (Shingrix) 02/18/2021, 06/20/2021    Health Maintenance  Topic Date Due   COVID-19 Vaccine (3 - 2023-24 season) 08/10/2022 (Originally 02/21/2022)   INFLUENZA VACCINE  09/21/2022 (Originally 01/21/2022)   MAMMOGRAM  04/04/2023   COLONOSCOPY (Pts 45-34yrs Insurance coverage will need to be confirmed)  06/22/2028   DTaP/Tdap/Td (2 - Td or Tdap) 01/11/2030   Hepatitis C Screening  Completed   HIV Screening  Completed   Zoster Vaccines- Shingrix  Completed   HPV VACCINES  Aged Out    Discussed health benefits of physical activity, and encouraged her to engage in regular exercise appropriate for her age and condition.  1. Annual physical exam Checking labs as below. UTD on preventative care. Wellness information provided with AVS. - Lipid panel - COMPLETE METABOLIC PANEL WITH GFR - CBC with Differential/Platelet  2. Lipid screening Checking lipids.  - Lipid panel  3. Diabetes mellitus screening Checking A1c.  - Hemoglobin A1c  4. Thyroid disorder screen Checking TSH. - TSH  5. Gastroesophageal reflux disease, unspecified whether esophagitis present Checking for H pylori. Adding Protonix  40mg  daily. Ok to continue OTC famotidine if desired.  - H. pylori breath test  6. Class 2 obesity due to excess calories without serious comorbidity with body mass index (BMI) of 39.0 to 39.9 in adult Discussed various medications available to help with weight loss. Recommend regular intentional exercise. Discussed dietary modifications to increase protein, reduce fat, and get moderate carbs. Discussed weighing and logging food intake to track calories and macros. After review, advised patient to contact her insurance company to ask about medications that are covered under her plan and let me know if she wants to give one of them  a try.   Return in about 1 year (around 07/26/2023) for annual physical exam.   Samuel Bouche, NP

## 2022-07-25 NOTE — Patient Instructions (Signed)
[place] for Gastroesophageal Reflux Disease, Adult When you have gastroesophageal reflux disease (GERD), the foods you eat and your eating habits are very important. Choosing the right foods can help ease the discomfort of GERD. Consider working with a dietitian to help you make healthy food choices. What are tips for following this plan? Reading food labels Look for foods that are low in saturated fat. Foods that have less than 5% of daily value (DV) of fat and 0 g of trans fats may help with your symptoms. Cooking Cook foods using methods other than frying. This may include baking, steaming, grilling, or broiling. These are all methods that do not need a lot of fat for cooking. To add flavor, try to use herbs that are low in spice and acidity. Meal planning  Choose healthy foods that are low in fat, such as fruits, vegetables, whole grains, low-fat dairy products, lean meats, fish, and poultry. Eat frequent, small meals instead of three large meals each day. Eat your meals slowly, in a relaxed setting. Avoid bending over or lying down until 2-3 hours after eating. Limit high-fat foods such as fatty meats or fried foods. Limit your intake of fatty foods, such as oils, butter, and shortening. Avoid the following as told by your health care provider: Foods that cause symptoms. These may be different for different people. Keep a food diary to keep track of foods that cause symptoms. Alcohol. Drinking large amounts of liquid with meals. Eating meals during the 2-3 hours before bed. Lifestyle Maintain a healthy weight. Ask your health care provider what weight is healthy for you. If you need to lose weight, work with your health care provider to do so safely. Exercise for at least 30 minutes on 5 or more days each week, or as told by your health care provider. Avoid wearing clothes that fit tightly around your waist and chest. Do not use any products  that contain nicotine or tobacco. These products include cigarettes, chewing tobacco, and vaping devices, such as e-cigarettes. If you need help quitting, ask your health care provider. Sleep with the head of your bed raised. Use a wedge under the mattress or blocks under the bed frame to raise the head of the bed. Chew sugar-free gum after mealtimes. What foods should I eat?  Eat a healthy, well-balanced diet of fruits, vegetables, whole grains, low-fat dairy products, lean meats, fish, and poultry. Each person is different. Foods that may trigger symptoms in one person may not trigger any symptoms in another person. Work with your health care provider to identify foods that are safe for you. The items listed above may not be a complete list of recommended foods and beverages. Contact a dietitian for more information. What foods should I avoid? Limiting some of these foods may help manage the symptoms of GERD. Everyone is different. Consult a dietitian or your health care provider to help you identify the exact foods to avoid, if any. Fruits Any fruits prepared with added fat. Any fruits that cause symptoms. For some people this may include citrus fruits, such as oranges, grapefruit, pineapple, and lemons. Vegetables Deep-fried vegetables. [place] fries. Any vegetables prepared with added fat. Any vegetables that cause symptoms. For some people, this may include tomatoes and tomato products, chili peppers, onions and garlic, and horseradish. Grains Pastries or quick breads with added fat. Meats and other proteins High-fat meats, such as fatty beef or pork, hot dogs, ribs, ham, sausage, salami, and bacon.  Fried meat or protein, including fried fish and fried chicken. Nuts and nut butters, in large amounts. Dairy Whole milk and chocolate milk. Sour cream. Cream. Ice cream. Cream cheese. Milkshakes. Fats and oils Butter. Margarine. Shortening. Ghee. Beverages Coffee and tea, with or without  caffeine. Carbonated beverages. Sodas. Energy drinks. Fruit juice made with acidic fruits, such as orange or grapefruit. Tomato juice. Alcoholic drinks. Sweets and desserts Chocolate and cocoa. Donuts. Seasonings and condiments Pepper. Peppermint and spearmint. Added salt. Any condiments, herbs, or seasonings that cause symptoms. For some people, this may include curry, hot sauce, or vinegar-based salad dressings. The items listed above may not be a complete list of foods and beverages to avoid. Contact a dietitian for more information. Questions to ask your health care provider Diet and lifestyle changes are usually the first steps that are taken to manage symptoms of GERD. If diet and lifestyle changes do not improve your symptoms, talk with your health care provider about taking medicines. Where to find more information International [employer] for Gastrointestinal Disorders: aboutgerd.org Summary When you have gastroesophageal reflux disease (GERD), food and lifestyle choices may be very helpful in easing the discomfort of GERD. Eat frequent, small meals instead of three large meals each day. Eat your meals slowly, in a relaxed setting. Avoid bending over or lying down until 2-3 hours after eating. Limit high-fat foods such as fatty meats or fried foods. This information is not intended to replace advice given to you by your health care provider. Make sure you discuss any questions you have with your health care provider. Document Revised: 12/19/2019 Document Reviewed: 12/19/2019 Elsevier Patient Education  [place].

## 2022-07-30 ENCOUNTER — Encounter: Payer: Self-pay | Admitting: Medical-Surgical

## 2022-07-30 DIAGNOSIS — K219 Gastro-esophageal reflux disease without esophagitis: Secondary | ICD-10-CM

## 2022-07-30 LAB — CBC WITH DIFFERENTIAL/PLATELET
Absolute Monocytes: 359 cells/uL (ref 200–950)
Basophils Absolute: 21 cells/uL (ref 0–200)
Basophils Relative: 0.4 %
Eosinophils Absolute: 52 cells/uL (ref 15–500)
Eosinophils Relative: 1 %
HCT: 36.8 % (ref 35.0–45.0)
Hemoglobin: 12.2 g/dL (ref 11.7–15.5)
Lymphs Abs: 1596 cells/uL (ref 850–3900)
MCH: 28.6 pg (ref 27.0–33.0)
MCHC: 33.2 g/dL (ref 32.0–36.0)
MCV: 86.4 fL (ref 80.0–100.0)
MPV: 10 fL (ref 7.5–12.5)
Monocytes Relative: 6.9 %
Neutro Abs: 3172 cells/uL (ref 1500–7800)
Neutrophils Relative %: 61 %
Platelets: 265 10*3/uL (ref 140–400)
RBC: 4.26 10*6/uL (ref 3.80–5.10)
RDW: 13.1 % (ref 11.0–15.0)
Total Lymphocyte: 30.7 %
WBC: 5.2 10*3/uL (ref 3.8–10.8)

## 2022-07-30 LAB — COMPLETE METABOLIC PANEL WITH GFR
AG Ratio: 1.4 (calc) (ref 1.0–2.5)
ALT: 9 U/L (ref 6–29)
AST: 13 U/L (ref 10–35)
Albumin: 3.9 g/dL (ref 3.6–5.1)
Alkaline phosphatase (APISO): 81 U/L (ref 37–153)
BUN: 18 mg/dL (ref 7–25)
CO2: 27 mmol/L (ref 20–32)
Calcium: 9.1 mg/dL (ref 8.6–10.4)
Chloride: 105 mmol/L (ref 98–110)
Creat: 0.74 mg/dL (ref 0.50–1.05)
Globulin: 2.7 g/dL (calc) (ref 1.9–3.7)
Glucose, Bld: 92 mg/dL (ref 65–99)
Potassium: 4 mmol/L (ref 3.5–5.3)
Sodium: 141 mmol/L (ref 135–146)
Total Bilirubin: 0.3 mg/dL (ref 0.2–1.2)
Total Protein: 6.6 g/dL (ref 6.1–8.1)
eGFR: 91 mL/min/{1.73_m2} (ref >=60)

## 2022-07-30 LAB — LIPID PANEL
Cholesterol: 208 mg/dL — ABNORMAL HIGH (ref <200)
HDL: 89 mg/dL (ref >=50)
LDL Cholesterol (Calc): 102 mg/dL (calc) — ABNORMAL HIGH
Non-HDL Cholesterol (Calc): 119 mg/dL (calc) (ref <130)
Total CHOL/HDL Ratio: 2.3 (calc) (ref <5.0)
Triglycerides: 79 mg/dL (ref <150)

## 2022-07-30 LAB — TSH: TSH: 1.15 mIU/L (ref 0.40–4.50)

## 2022-07-30 LAB — HEMOGLOBIN A1C
Hgb A1c MFr Bld: 5.4 % of total Hgb (ref <5.7)
Mean Plasma Glucose: 108 mg/dL
eAG (mmol/L): 6 mmol/L

## 2022-07-30 LAB — H. PYLORI BREATH TEST

## 2022-07-30 MED ORDER — WEGOVY 0.25 MG/0.5ML ~~LOC~~ SOAJ: 0.2500 mg | SUBCUTANEOUS | 0 refills | Status: DC

## 2022-07-31 ENCOUNTER — Other Ambulatory Visit: Payer: Self-pay

## 2022-07-31 NOTE — Addendum Note (Signed)
Addended bySamuel Bouche on: 07/31/2022 07:46 AM   Modules accepted: Orders

## 2022-08-07 ENCOUNTER — Telehealth: Payer: Self-pay

## 2022-08-07 NOTE — Telephone Encounter (Addendum)
Initiated Prior authorization PTW:SFKCLE 0.25MG /0.5ML auto-injectors Via: Rxbprompt Case/Key:[id number]  Status: approved  as of 08/07/22 Reason:no dates given Notified Pt via: Mychart   Prior Auth (EOC) ID: [id number] Drug/Service Name: Mancel Parsons 0.25 MG/0.5 ML PEN Patient: Kylie Flores Date Requested: 08/07/2022 1:09:53 PM   MemberID: [id number] DOB: January 19, 1961

## 2022-09-02 MED ORDER — SEMAGLUTIDE-WEIGHT MANAGEMENT 1 MG/0.5ML ~~LOC~~ SOAJ: 1.0000 mg | SUBCUTANEOUS | 0 refills | Status: AC

## 2022-09-02 MED ORDER — SEMAGLUTIDE-WEIGHT MANAGEMENT 1.7 MG/0.75ML ~~LOC~~ SOAJ: 1.7000 mg | SUBCUTANEOUS | 0 refills | Status: DC

## 2022-09-02 MED ORDER — SEMAGLUTIDE-WEIGHT MANAGEMENT 2.4 MG/0.75ML ~~LOC~~ SOAJ: 2.4000 mg | SUBCUTANEOUS | 0 refills | Status: DC

## 2022-09-02 MED ORDER — SEMAGLUTIDE-WEIGHT MANAGEMENT 0.5 MG/0.5ML ~~LOC~~ SOAJ: 0.5000 mg | SUBCUTANEOUS | 0 refills | Status: AC

## 2022-09-02 NOTE — Addendum Note (Signed)
Addended bySamuel Bouche on: 09/02/2022 11:50 AM   Modules accepted: Orders

## 2022-10-20 ENCOUNTER — Encounter: Payer: Self-pay | Admitting: Medical-Surgical

## 2022-12-14 ENCOUNTER — Encounter: Payer: Self-pay | Admitting: Medical-Surgical

## 2022-12-15 ENCOUNTER — Telehealth: Payer: Self-pay | Admitting: Medical-Surgical

## 2022-12-15 MED ORDER — SEMAGLUTIDE-WEIGHT MANAGEMENT 2.4 MG/0.75ML ~~LOC~~ SOAJ: 2.4000 mg | SUBCUTANEOUS | 1 refills | Status: DC

## 2022-12-15 NOTE — Telephone Encounter (Signed)
Made in error

## 2023-02-27 ENCOUNTER — Encounter: Payer: Self-pay | Admitting: Medical-Surgical

## 2023-04-07 LAB — HM MAMMOGRAPHY

## 2023-04-22 ENCOUNTER — Encounter: Payer: Self-pay | Admitting: Medical-Surgical

## 2023-04-23 ENCOUNTER — Encounter: Payer: Self-pay | Admitting: Medical-Surgical

## 2023-04-23 ENCOUNTER — Telehealth: Payer: No Typology Code available for payment source | Admitting: Medical-Surgical

## 2023-04-23 DIAGNOSIS — N952 Postmenopausal atrophic vaginitis: Secondary | ICD-10-CM

## 2023-04-23 DIAGNOSIS — N951 Menopausal and female climacteric states: Secondary | ICD-10-CM

## 2023-04-23 MED ORDER — PREMARIN 0.625 MG/GM VA CREA: TOPICAL_CREAM | VAGINAL | 12 refills | Status: DC

## 2023-04-23 NOTE — Progress Notes (Signed)
Virtual Visit via Video Note  I connected with Kylie Flores on 04/23/23 at  8:30 AM EDT by a video enabled telemedicine application and verified that I am speaking with the correct person using two identifiers.   I discussed the limitations of evaluation and management by telemedicine and the availability of in person appointments. The patient expressed understanding and agreed to proceed.  Patient location: home Provider locations: office  Subjective:    CC: Vaginal dryness  HPI: Pleasant 62 year old female presenting via MyChart video visit with reports of vaginal dryness that has been present since her hysterectomy.  It has not been a problem until recently.  She is now in a new relationship and feels that they will become sexually active.  Has had significant vaginal dryness and notes that this will be a problem.  She did use Estrace after her hysterectomy and saw mild to moderate improvement in her symptoms but that was many years ago.  She is open to options at this point.  No history of female cancers for her or anyone in her family.   Past medical history, Surgical history, Family history not pertinant except as noted below, Social history, Allergies, and medications have been entered into the medical record, reviewed, and corrections made.   Review of Systems: See HPI for pertinent positives and negatives.   Objective:    General: Speaking clearly in complete sentences without any shortness of breath.  Alert and oriented x3.  Normal judgment. No apparent acute distress.  Impression and Recommendations:    1. Vaginal dryness, menopausal Discussed various options including topical creams, vaginal inserts, and systemic estrogen.  After review of options, she would like to try Premarin cream if this is covered by her insurance to give the topical another try.  Advised to insert 1 applicatorful into the vagina nightly for 14 days then reduce use to twice weekly.  Plan to touch base in  about 4 weeks to evaluate effectiveness.  I discussed the assessment and treatment plan with the patient. The patient was provided an opportunity to ask questions and all were answered. The patient agreed with the plan and demonstrated an understanding of the instructions.   The patient was advised to call back or seek an in-person evaluation if the symptoms worsen or if the condition fails to improve as anticipated.  Return in about 4 weeks (around 05/21/2023) for Vaginal dryness follow-up (okay to be virtual).  Thayer Ohm, DNP, APRN, FNP-BC [facility]

## 2023-07-27 ENCOUNTER — Encounter: Payer: Self-pay | Admitting: Medical-Surgical

## 2023-07-27 ENCOUNTER — Ambulatory Visit (INDEPENDENT_AMBULATORY_CARE_PROVIDER_SITE_OTHER): Payer: No Typology Code available for payment source | Admitting: Medical-Surgical

## 2023-07-27 VITALS — BP 112/71 | HR 69 | Resp 20 | Ht 64.0 in | Wt 197.5 lb

## 2023-07-27 DIAGNOSIS — G4762 Sleep related leg cramps: Secondary | ICD-10-CM | POA: Insufficient documentation

## 2023-07-27 DIAGNOSIS — R42 Dizziness and giddiness: Secondary | ICD-10-CM

## 2023-07-27 DIAGNOSIS — R519 Headache, unspecified: Secondary | ICD-10-CM | POA: Insufficient documentation

## 2023-07-27 DIAGNOSIS — Z0000 Encounter for general adult medical examination without abnormal findings: Secondary | ICD-10-CM | POA: Insufficient documentation

## 2023-07-27 DIAGNOSIS — N949 Unspecified condition associated with female genital organs and menstrual cycle: Secondary | ICD-10-CM

## 2023-07-27 DIAGNOSIS — M79604 Pain in right leg: Secondary | ICD-10-CM

## 2023-07-27 DIAGNOSIS — I890 Lymphedema, not elsewhere classified: Secondary | ICD-10-CM

## 2023-07-27 DIAGNOSIS — M79605 Pain in left leg: Secondary | ICD-10-CM

## 2023-07-27 MED ORDER — ESTRADIOL 0.1 MG/GM VA CREA: TOPICAL_CREAM | VAGINAL | 12 refills | Status: AC

## 2023-07-27 NOTE — Progress Notes (Signed)
Annual Wellness Visit     Patient: Kylie Flores, Female    DOB: [birth year], 63 y.o.   MRN: [id number]  Subjective  Chief Complaint  Patient presents with   Annual Exam    Kylie Flores is a 63 y.o. female who presents today for her Annual Wellness Visit. She reports consuming a general, low fat, and low sodium diet. Home exercise routine includes walking 1 hrs per week. She generally feels well. She reports sleeping well. She does have additional problems to discuss today.   HPI  Vision:Not within last year  Pt to schedule in next 2 months Mamogram scheduled this year Colonoscopy Pt will schedule later this week  Patient Active Problem List   Diagnosis Date Noted   Ocular headache 07/27/2023   Annual physical exam 07/27/2023   Nocturnal leg cramps 07/27/2023   Gastroesophageal reflux disease 07/25/2022   Pain and swelling of right lower leg 04/18/2022   Biceps tendinitis of left shoulder 03/21/2022   Lymph edema 01/12/2020   Vertigo 01/12/2020   Abnormal mammogram 10/05/2019   Closed 3-part fracture of proximal humerus, left, initial encounter 10/03/2016   Past Medical History:  Diagnosis Date   Abnormal mammogram    Colon polyps    History of blood transfusion    Multinodular goiter 07/23/2021   Nondisplaced fracture of greater tuberosity of right humerus, initial encounter for closed fracture 04/03/2018   Formatting of this note is different from the original.  MRI right shoulder- 03/12/2018- findings suggestive of a healing nondisplaced fracture of the greater tuberosity, supraspinatus and infraspinatus intact, moderate bursitis, mild to moderate AC joint arthritis and mild labral degeneration Formatting of this note might be different from the original. Formatting of this note is different from t   Obesity (BMI 30.0-34.9)    Pneumonia    Thyroid disease    Tuberculosis    Past Surgical History:  Procedure Laterality Date   ANGIOPLASTY     BREAST LUMPECTOMY      CHOLECYSTECTOMY     TOTAL ABDOMINAL HYSTERECTOMY     No Known Allergies    Medications: Outpatient Medications Prior to Visit  Medication Sig   Multiple Vitamins-Minerals (MULTIVITAMIN ADULTS PO) Take by mouth.   [DISCONTINUED] conjugated estrogens (PREMARIN) vaginal cream Place 1 applicatorful into the vagina nightly for 14 days then reduce to 1 applicatorful into the vagina twice weekly.   No facility-administered medications prior to visit.    No Known Allergies  Patient Care Team: Christen Butter, NP as PCP - General (Nurse Practitioner)  Review of Systems  Constitutional: Negative.  Negative for chills, diaphoresis, fever, malaise/fatigue and weight loss.  HENT:  Positive for nosebleeds and tinnitus. Negative for congestion, ear discharge, ear pain, hearing loss, sinus pain and sore throat.        Tinnitus left ear  Easily get nose bleedds in right nostril. Difficult to stop. Has been told that she may need to be cauterized by EENT  Eyes: Negative.  Negative for blurred vision, double vision, photophobia, pain, discharge and redness.  Respiratory: Negative.  Negative for cough, hemoptysis, sputum production, shortness of breath, wheezing and stridor.   Cardiovascular: Negative.  Negative for chest pain, palpitations, orthopnea, claudication, leg swelling and PND.  Gastrointestinal: Negative.  Negative for abdominal pain, blood in stool, constipation, diarrhea, heartburn, melena, nausea and vomiting.  Genitourinary:  Negative for dysuria, flank pain, frequency, hematuria and urgency.       Vaginal dryness secondary to hysterectomy history  Musculoskeletal:  Negative  for back pain, falls, joint pain, myalgias and neck pain.  Skin: Negative.  Negative for itching and rash.  Neurological:  Positive for dizziness, sensory change and headaches. Negative for tingling, tremors, speech change, focal weakness, seizures, loss of consciousness and weakness.       Vertigo treated with meclizine  Last vertigo episode lasted 1 week. Has had 5 episodes in last year.  HA associated bilateral visual disturbances  Endo/Heme/Allergies:  Negative for environmental allergies and polydipsia. Bruises/bleeds easily.       Easy to have right nostril nose bleed  Psychiatric/Behavioral:  Negative for depression, hallucinations, memory loss, substance abuse and suicidal ideas. The patient has insomnia. The patient is not nervous/anxious.        Difficulty sleeping because lower extremity pain and cramping        Objective  BP 112/71 (BP Location: Left Arm, Cuff Size: Normal)   Pulse 69   Resp 20   Ht 5\' 4"  (1.626 m)   Wt 197 lb 8 oz (89.6 kg)   SpO2 99%   BMI 33.90 kg/m  BP Readings from Last 3 Encounters:  07/27/23 112/71  07/25/22 136/83  04/18/22 112/71      Physical Exam Constitutional:      General: She is not in acute distress.    Appearance: She is normal weight. She is not ill-appearing.  HENT:     Head: Normocephalic and atraumatic.     Nose: Nose normal.     Mouth/Throat:     Mouth: Mucous membranes are moist.     Pharynx: Oropharynx is clear.  Eyes:     General: Lids are normal.     Extraocular Movements:     Right eye: Normal extraocular motion and no nystagmus.     Left eye: Normal extraocular motion and no nystagmus.     Conjunctiva/sclera: Conjunctivae normal.     Pupils: Pupils are equal, round, and reactive to light.  Neck:     Thyroid: No thyroid mass, thyromegaly or thyroid tenderness.     Vascular: No JVD.     Trachea: No tracheal tenderness.  Cardiovascular:     Rate and Rhythm: Normal rate and regular rhythm. No extrasystoles are present.    Pulses: Normal pulses.          Radial pulses are 2+ on the right side and 2+ on the left side.     Heart sounds: Normal heart sounds, S1 normal and S2 normal. No murmur heard. Pulmonary:     Effort: Pulmonary effort is normal.     Breath sounds: Normal breath sounds. No decreased breath sounds, wheezing,  rhonchi or rales.  Abdominal:     General: Abdomen is flat. Bowel sounds are normal. There is no distension. There are no signs of injury.     Palpations: Abdomen is soft.     Tenderness: There is no abdominal tenderness.  Musculoskeletal:        General: Normal range of motion.     Cervical back: Normal range of motion. No edema, signs of trauma or rigidity. No pain with movement.     Right foot: Normal range of motion.     Left foot: Normal range of motion.  Feet:     Right foot:     Skin integrity: Skin integrity normal.     Left foot:     Skin integrity: Skin integrity normal.  Lymphadenopathy:     Head:     Right side of head: No submental,  submandibular, tonsillar, preauricular, posterior auricular or occipital adenopathy.     Left side of head: No submental, submandibular, tonsillar, preauricular, posterior auricular or occipital adenopathy.     Cervical: No cervical adenopathy.  Skin:    General: Skin is warm and dry.     Capillary Refill: Capillary refill takes less than 2 seconds.     Coloration: Skin is not ashen, cyanotic, jaundiced, mottled, pale or sallow.     Findings: No abrasion or erythema.  Neurological:     General: No focal deficit present.     Mental Status: She is alert.     Cranial Nerves: Cranial nerves 2-12 are intact.     Sensory: Sensation is intact.     Motor: Motor function is intact.     Coordination: Coordination is intact.     Gait: Gait is intact.     Deep Tendon Reflexes: Reflexes are normal and symmetric.     Reflex Scores:      Tricep reflexes are 2+ on the right side and 2+ on the left side.      Bicep reflexes are 2+ on the right side and 2+ on the left side.      Brachioradialis reflexes are 2+ on the right side and 2+ on the left side.      Patellar reflexes are 2+ on the right side and 2+ on the left side.      Achilles reflexes are 2+ on the right side and 2+ on the left side. Psychiatric:        Attention and Perception: Attention  and perception normal.        Mood and Affect: Mood and affect normal.        Speech: Speech normal.        Behavior: Behavior normal.        Thought Content: Thought content normal.        Cognition and Memory: Cognition and memory normal.        Judgment: Judgment normal.      Most recent fall risk assessment:    07/25/2022    3:52 PM  Fall Risk   Falls in the past year? 0  Number falls in past yr: 0  Injury with Fall? 0  Risk for fall due to : No Fall Risks  Follow up Falls evaluation completed    Most recent depression screenings:    07/27/2023    3:30 PM 07/25/2022    3:52 PM  PHQ 2/9 Scores  PHQ - 2 Score 0 0   Most recent cognitive screening:     No data to display         Most recent Audit-C alcohol use screening    07/20/2023    9:09 AM  Alcohol Use Disorder Test (AUDIT)  1. How often do you have a drink containing alcohol? 0  2. How many drinks containing alcohol do you have on a typical day when you are drinking? 0  3. How often do you have six or more drinks on one occasion? 0  AUDIT-C Score 0      Patient-reported   A score of 3 or more in women, and 4 or more in men indicates increased risk for alcohol abuse, EXCEPT if all of the points are from question 1   Vision/Hearing Screen: No results found.  Last CBC Lab Results  Component Value Date   WBC 5.2 07/25/2022   HGB 12.2 07/25/2022   HCT 36.8 07/25/2022  MCV 86.4 07/25/2022   MCH 28.6 07/25/2022   RDW 13.1 07/25/2022   PLT 265 07/25/2022   Last metabolic panel Lab Results  Component Value Date   GLUCOSE 92 07/25/2022   NA 141 07/25/2022   K 4.0 07/25/2022   CL 105 07/25/2022   CO2 27 07/25/2022   BUN 18 07/25/2022   CREATININE 0.74 07/25/2022   EGFR 91 07/25/2022   CALCIUM 9.1 07/25/2022   PROT 6.6 07/25/2022   ALBUMIN 4.2 01/18/2020   BILITOT 0.3 07/25/2022   ALKPHOS 95 01/18/2020   AST 13 07/25/2022   ALT 9 07/25/2022   Last lipids Lab Results  Component Value Date    CHOL 208 (H) 07/25/2022   HDL 89 07/25/2022   LDLCALC 102 (H) 07/25/2022   TRIG 79 07/25/2022   CHOLHDL 2.3 07/25/2022      No results found for any visits on 07/27/23.    Assessment & Plan   Annual wellness visit done today including the all of the following: Reviewed patient's Family Medical History Reviewed and updated list of patient's medical providers Assessment of cognitive impairment was done Assessed patient's functional ability Established a written schedule for health screening services Health Risk Assessent Completed and Reviewed  Exercise Activities and Dietary recommendations  Goals   None     Immunization History  Administered Date(s) Administered   Influenza,inj,Quad PF,6+ Mos 02/18/2021   Influenza,inj,quad, With Preservative 03/30/2019   Influenza-Unspecified 03/30/2019, 03/26/2023   PFIZER(Purple Top)SARS-COV-2 Vaccination 01/20/2020, 02/10/2020   Tdap 01/12/2020   Zoster Recombinant(Shingrix) 02/18/2021, 06/20/2021    Health Maintenance  Topic Date Due   COVID-19 Vaccine (3 - 2024-25 season) 08/12/2023 (Originally 02/22/2023)   MAMMOGRAM  04/06/2025   Colonoscopy  06/22/2028   DTaP/Tdap/Td (2 - Td or Tdap) 01/11/2030   INFLUENZA VACCINE  Completed   Hepatitis C Screening  Completed   HIV Screening  Completed   Zoster Vaccines- Shingrix  Completed   HPV VACCINES  Aged Out     Discussed health benefits of physical activity, and encouraged her to engage in regular exercise appropriate for her age and condition.    Problem List Items Addressed This Visit       Other   Lymph edema.  Chronic and congenital manages with compression sock 2+ pedal edema today. Pt states this is baseline   Vertigo - Primary Chronic condition with unknown trigger. 5 episodes in last year. Last episode 1 weeks long Pt treated with meclizine PO. Pt states that the meclizine makes her drowsy but it works well. Pt unable to do dix- hallpike maneuver because of nausea.  Vertigo is generally debilitating.   Ocular headache  New onset while working on computer. Pt had HA associated with visual disturbances that affected visual acuity for 30 min and resolved spontaneously. Pt states she could see the visual field changes despite having eyes closed. Pt due for eye exam and will make appointment.    Annual physical exam   Relevant Orders   CBC with Differential/Platelet   CMP14+EGFR   Lipid panel   Nocturnal leg cramps & Pain in lower extremities  Pt having cramps and leg pain HS that impairs ability go to and remain asleep. Pt states that the cramps can be resolved gradually with stretching and mustard with limited success. Nothing resolves deep leg pain> Patient requests that we check her electrolytes. Continue taking PO daily MVI.   Relevant orders           VITAMIN D 25 Hydroxy (  Vit-D Deficiency, Fractures)           Magnesium           CBC with Differential/Platelet           CMP14+EGFR  Vaginal Dryness  Pt uses Premarin twice weekly for Vaginal dryness after hysterectomy. Pt states it is working well without side effects but asks if there is a less expensive alternative.   Start estradiol (ESTRACE VAGINAL) 0.1 MG/GM vaginal cream twice weekly vaginally    Return in about 1 year (around 07/26/2024) for annual physical exam.     Loma Sousa, RN

## 2023-07-27 NOTE — Patient Instructions (Signed)
 Preventive Care 64-64 Years Old, Female  Preventive care refers to lifestyle choices and visits with your health care provider that can promote health and wellness. Preventive care visits are also called wellness exams.  What can I expect for my preventive care visit?  Counseling  Your health care provider may ask you questions about your:  Medical history, including:  Past medical problems.  Family medical history.  Pregnancy history.  Current health, including:  Menstrual cycle.  Method of birth control.  Emotional well-being.  Home life and relationship well-being.  Sexual activity and sexual health.  Lifestyle, including:  Alcohol, nicotine or tobacco, and drug use.  Access to firearms.  Diet, exercise, and sleep habits.  Work and work Astronomer.  Sunscreen use.  Safety issues such as seatbelt and bike helmet use.  Physical exam  Your health care provider will check your:  Height and weight. These may be used to calculate your BMI (body mass index). BMI is a measurement that tells if you are at a healthy weight.  Waist circumference. This measures the distance around your waistline. This measurement also tells if you are at a healthy weight and may help predict your risk of certain diseases, such as type 2 diabetes and high blood pressure.  Heart rate and blood pressure.  Body temperature.  Skin for abnormal spots.  What immunizations do I need?    Vaccines are usually given at various ages, according to a schedule. Your health care provider will recommend vaccines for you based on your age, medical history, and lifestyle or other factors, such as travel or where you work.  What tests do I need?  Screening  Your health care provider may recommend screening tests for certain conditions. This may include:  Lipid and cholesterol levels.  Diabetes screening. This is done by checking your blood sugar (glucose) after you have not eaten for a while (fasting). 63  Pelvic exam and Pap test.  Hepatitis B test.  Hepatitis C  test.  HIV (human immunodeficiency virus) test.  STI (sexually transmitted infection) testing, if you are at risk.  Lung cancer screening.  Colorectal cancer screening.  Mammogram. Talk with your health care provider about when you should start having regular mammograms. This may depend on whether you have a family history of breast cancer.  BRCA-related cancer screening. This may be done if you have a family history of breast, ovarian, tubal, or peritoneal cancers.  Bone density scan. This is done to screen for osteoporosis.  Talk with your health care provider about your test results, treatment options, and if necessary, the need for more tests.  Follow these instructions at home:  Eating and drinking    Eat a diet that includes fresh fruits and vegetables, whole grains, lean protein, and low-fat dairy products.  Take vitamin and mineral supplements as recommended by your health care provider.  Do not drink alcohol if:  Your health care provider tells you not to drink.  You are pregnant, may be pregnant, or are planning to become pregnant.  If you drink alcohol:  Limit how much you have to 0-1 drink a day.  Know how much alcohol is in your drink. In the [place] one drink equals one 12 oz bottle of beer (355 mL), one 5 oz glass of wine (148 mL), or one 1 oz glass of hard liquor (44 mL).  Lifestyle  Brush your teeth every morning and night with fluoride toothpaste. Floss one time each day.  Exercise for at least  30 minutes 5 or more days each week.  Do not use any products that contain nicotine or tobacco. These products include cigarettes, chewing tobacco, and vaping devices, such as e-cigarettes. If you need help quitting, ask your health care provider.  Do not use drugs.  If you are sexually active, practice safe sex. Use a condom or other form of protection to prevent STIs.  If you do not wish to become pregnant, use a form of birth control. If you plan to become pregnant, see your health care provider for a  prepregnancy visit.  Take aspirin only as told by your health care provider. Make sure that you understand how much to take and what form to take. Work with your health care provider to find out whether it is safe and beneficial for you to take aspirin daily.  Find healthy ways to manage stress, such as:  Meditation, yoga, or listening to music.  Journaling.  Talking to a trusted person.  Spending time with friends and family.  Minimize exposure to UV radiation to reduce your risk of skin cancer.  Safety  Always wear your seat belt while driving or riding in a vehicle.  Do not drive:  If you have been drinking alcohol. Do not ride with someone who has been drinking.  When you are tired or distracted.  While texting.  If you have been using any mind-altering substances or drugs.  Wear a helmet and other protective equipment during sports activities.  If you have firearms in your house, make sure you follow all gun safety procedures.  Seek help if you have been physically or sexually abused.  What's next?  Visit your health care provider once a year for an annual wellness visit.  Ask your health care provider how often you should have your eyes and teeth checked.  Stay up to date on all vaccines.  This information is not intended to replace advice given to you by your health care provider. Make sure you discuss any questions you have with your health care provider.  Document Revised: 12/05/2020 Document Reviewed: 12/05/2020  Elsevier Patient Education  2024 [employer].

## 2023-07-27 NOTE — Progress Notes (Signed)
 Medical screening examination/treatment was performed by qualified clinical staff member and as supervising provider I was immediately available for consultation/collaboration. I have reviewed documentation and agree with assessment and plan.  Thayer Ohm, DNP, APRN, FNP-BC [facility]

## 2023-07-28 ENCOUNTER — Encounter: Payer: Self-pay | Admitting: Medical-Surgical

## 2023-07-28 LAB — CMP14+EGFR
ALT: 10 [IU]/L (ref 0–32)
AST: 13 [IU]/L (ref 0–40)
Albumin: 3.9 g/dL (ref 3.9–4.9)
Alkaline Phosphatase: 93 [IU]/L (ref 44–121)
BUN/Creatinine Ratio: 19 (ref 12–28)
BUN: 16 mg/dL (ref 8–27)
Bilirubin Total: <0.2 mg/dL (ref 0.0–1.2)
CO2: 27 mmol/L (ref 20–29)
Calcium: 8.9 mg/dL (ref 8.7–10.3)
Chloride: 105 mmol/L (ref 96–106)
Creatinine, Ser: 0.86 mg/dL (ref 0.57–1.00)
Globulin, Total: 2.2 g/dL (ref 1.5–4.5)
Glucose: 88 mg/dL (ref 70–99)
Potassium: 4.4 mmol/L (ref 3.5–5.2)
Sodium: 143 mmol/L (ref 134–144)
Total Protein: 6.1 g/dL (ref 6.0–8.5)
eGFR: 76 mL/min/{1.73_m2} (ref >59)

## 2023-07-28 LAB — CBC WITH DIFFERENTIAL/PLATELET
Basophils Absolute: 0 10*3/uL (ref 0.0–0.2)
Basos: 0 %
EOS (ABSOLUTE): 0.1 10*3/uL (ref 0.0–0.4)
Eos: 1 %
Hematocrit: 37.7 % (ref 34.0–46.6)
Hemoglobin: 12.3 g/dL (ref 11.1–15.9)
Immature Grans (Abs): 0 10*3/uL (ref 0.0–0.1)
Immature Granulocytes: 0 %
Lymphocytes Absolute: 1.6 10*3/uL (ref 0.7–3.1)
Lymphs: 28 %
MCH: 29.4 pg (ref 26.6–33.0)
MCHC: 32.6 g/dL (ref 31.5–35.7)
MCV: 90 fL (ref 79–97)
Monocytes Absolute: 0.4 10*3/uL (ref 0.1–0.9)
Monocytes: 7 %
Neutrophils Absolute: 3.6 10*3/uL (ref 1.4–7.0)
Neutrophils: 64 %
Platelets: 242 10*3/uL (ref 150–450)
RBC: 4.18 x10E6/uL (ref 3.77–5.28)
RDW: 12.6 % (ref 11.7–15.4)
WBC: 5.7 10*3/uL (ref 3.4–10.8)

## 2023-07-28 LAB — LIPID PANEL
Chol/HDL Ratio: 2.4 {ratio} (ref 0.0–4.4)
Cholesterol, Total: 190 mg/dL (ref 100–199)
HDL: 79 mg/dL (ref >39)
LDL Chol Calc (NIH): 94 mg/dL (ref 0–99)
Triglycerides: 93 mg/dL (ref 0–149)
VLDL Cholesterol Cal: 17 mg/dL (ref 5–40)

## 2023-07-28 LAB — MAGNESIUM: Magnesium: 1.9 mg/dL (ref 1.6–2.3)

## 2023-07-28 LAB — VITAMIN D 25 HYDROXY (VIT D DEFICIENCY, FRACTURES): Vit D, 25-Hydroxy: 37.4 ng/mL (ref 30.0–100.0)

## 2023-08-14 ENCOUNTER — Encounter: Payer: Self-pay | Admitting: Medical-Surgical

## 2023-08-26 ENCOUNTER — Telehealth: Payer: Self-pay

## 2023-08-26 DIAGNOSIS — R42 Dizziness and giddiness: Secondary | ICD-10-CM

## 2023-08-26 NOTE — Telephone Encounter (Signed)
 Copied from CRM [id number]. Topic: General - Other >> Aug 26, 2023 12:20 PM Emylou G wrote: Reason for CRM: Jan w/ rehab [facility] called needs the actual referral and prescription from her pcp.. please fax: [phone] due to appt scheduled

## 2024-01-22 ENCOUNTER — Other Ambulatory Visit: Payer: Self-pay | Admitting: Medical Genetics

## 2024-04-18 ENCOUNTER — Other Ambulatory Visit: Payer: Self-pay | Admitting: Medical Genetics

## 2024-04-18 DIAGNOSIS — Z006 Encounter for examination for normal comparison and control in clinical research program: Secondary | ICD-10-CM

## 2024-08-08 ENCOUNTER — Encounter: Admitting: Medical-Surgical
